# Patient Record
Sex: Female | Born: 1960 | Race: Black or African American | Hispanic: No | Marital: Single | Smoking: Never smoker
Health system: Southern US, Community
[De-identification: ages and names within clinical notes are randomized; demographics above are authoritative.]

## PROBLEM LIST (undated history)

## (undated) DIAGNOSIS — M1990 Unspecified osteoarthritis, unspecified site: Secondary | ICD-10-CM

## (undated) DIAGNOSIS — E785 Hyperlipidemia, unspecified: Secondary | ICD-10-CM

## (undated) DIAGNOSIS — S62609A Fracture of unspecified phalanx of unspecified finger, initial encounter for closed fracture: Secondary | ICD-10-CM

## (undated) DIAGNOSIS — I10 Essential (primary) hypertension: Secondary | ICD-10-CM

## (undated) HISTORY — PX: COLONOSCOPY: SHX174

---

## 1999-06-29 ENCOUNTER — Emergency Department (HOSPITAL_COMMUNITY): Admission: EM | Admit: 1999-06-29 | Discharge: 1999-06-29 | Payer: Self-pay | Admitting: Emergency Medicine

## 1999-07-20 ENCOUNTER — Other Ambulatory Visit: Admission: RE | Admit: 1999-07-20 | Discharge: 1999-07-20 | Payer: Self-pay | Admitting: Obstetrics and Gynecology

## 2001-01-02 ENCOUNTER — Other Ambulatory Visit: Admission: RE | Admit: 2001-01-02 | Discharge: 2001-01-02 | Payer: Self-pay | Admitting: Obstetrics and Gynecology

## 2003-12-03 ENCOUNTER — Other Ambulatory Visit: Admission: RE | Admit: 2003-12-03 | Discharge: 2003-12-03 | Payer: Self-pay | Admitting: Obstetrics and Gynecology

## 2005-03-29 ENCOUNTER — Other Ambulatory Visit: Admission: RE | Admit: 2005-03-29 | Discharge: 2005-03-29 | Payer: Self-pay | Admitting: Obstetrics and Gynecology

## 2005-07-19 ENCOUNTER — Emergency Department (HOSPITAL_COMMUNITY): Admission: EM | Admit: 2005-07-19 | Discharge: 2005-07-20 | Payer: Self-pay | Admitting: Emergency Medicine

## 2005-07-21 ENCOUNTER — Emergency Department (HOSPITAL_COMMUNITY): Admission: EM | Admit: 2005-07-21 | Discharge: 2005-07-21 | Payer: Self-pay | Admitting: Emergency Medicine

## 2008-02-06 ENCOUNTER — Emergency Department (HOSPITAL_COMMUNITY): Admission: EM | Admit: 2008-02-06 | Discharge: 2008-02-06 | Payer: Self-pay | Admitting: Emergency Medicine

## 2010-02-01 ENCOUNTER — Emergency Department (HOSPITAL_COMMUNITY): Admission: EM | Admit: 2010-02-01 | Discharge: 2010-02-01 | Payer: Self-pay | Admitting: Emergency Medicine

## 2012-10-28 ENCOUNTER — Encounter (HOSPITAL_COMMUNITY): Payer: Self-pay | Admitting: *Deleted

## 2012-10-28 ENCOUNTER — Emergency Department (INDEPENDENT_AMBULATORY_CARE_PROVIDER_SITE_OTHER)
Admission: EM | Admit: 2012-10-28 | Discharge: 2012-10-28 | Disposition: A | Discharge status: Home / Self Care | Payer: 59 | Source: Home / Self Care

## 2012-10-28 DIAGNOSIS — Z23 Encounter for immunization: Secondary | ICD-10-CM

## 2012-10-28 DIAGNOSIS — IMO0002 Reserved for concepts with insufficient information to code with codable children: Secondary | ICD-10-CM

## 2012-10-28 DIAGNOSIS — L02411 Cutaneous abscess of right axilla: Secondary | ICD-10-CM

## 2012-10-28 HISTORY — DX: Essential (primary) hypertension: I10

## 2012-10-28 MED ORDER — TETANUS-DIPHTH-ACELL PERTUSSIS 5-2.5-18.5 LF-MCG/0.5 IM SUSP
INTRAMUSCULAR | Status: AC
  Filled 2012-10-28: qty 0.5

## 2012-10-28 MED ORDER — TETANUS-DIPHTH-ACELL PERTUSSIS 5-2.5-18.5 LF-MCG/0.5 IM SUSP
0.5000 mL | Freq: Once | INTRAMUSCULAR | Status: AC
  Administered 2012-10-28: 0.5 mL via INTRAMUSCULAR

## 2012-10-28 NOTE — ED Provider Notes (Signed)
CSN: [id number]     Arrival date & time 10/28/12  1420 History     First MD Initiated Contact with Patient 10/28/12 1555     Chief Complaint  Patient presents with  . Abscess   (Consider location/radiation/quality/duration/timing/severity/associated sxs/prior Treatment) HPI Comments: 52 year old female presents complaining of abscess in her right armpit for the past 4 days. She has pain and pressure as well as obvious swelling in the area. She has been applying warm compresses to this hoping that it would start to drain but it has not. She did not have any previous history of abscesses or immune deficiencies. She denies any fever, chills, dizziness, NVD.  Patient is a 52 y.o. female presenting with abscess.  Abscess Associated symptoms: no fever, no nausea and no vomiting     Past Medical History  Diagnosis Date  . Hypertension    History reviewed. No pertinent past surgical history. No family history on file. History  Substance Use Topics  . Smoking status: Not on file  . Smokeless tobacco: Not on file  . Alcohol Use: No   OB History   Grav Para Term Preterm Abortions TAB SAB Ect Mult Living                 Review of Systems  Constitutional: Negative for fever and chills.  Eyes: Negative for visual disturbance.  Respiratory: Negative for cough and shortness of breath.   Cardiovascular: Negative for chest pain, palpitations and leg swelling.  Gastrointestinal: Negative for nausea, vomiting and abdominal pain.  Endocrine: Negative for polydipsia and polyuria.  Genitourinary: Negative for dysuria, urgency and frequency.  Musculoskeletal: Negative for myalgias and arthralgias.  Skin: Positive for wound (see history of present illness regarding abscess). Negative for rash.  Neurological: Negative for dizziness, weakness and light-headedness.    Allergies  Lisinopril  Home Medications   Current Outpatient Rx  Name  Route  Sig  Dispense  Refill  . amLODipine (NORVASC)  10 MG tablet   Oral   Take 10 mg by mouth daily.          BP 156/91  Pulse 76  Temp(Src) 98 F (36.7 C) (Oral)  Resp 17  SpO2 99% Physical Exam  Nursing note and vitals reviewed. Constitutional: She is oriented to person, place, and time. She appears well-developed and well-nourished.  HENT:  Head: Normocephalic and atraumatic.  Musculoskeletal: Normal range of motion.  Neurological: She is alert and oriented to person, place, and time.  Skin: Skin is warm and dry. Lesion (There is a spontaneously draining abscess in the right axilla with minimal surrounding induration) noted.  Psychiatric: She has a normal mood and affect. Judgment normal.    ED Course   INCISION AND DRAINAGE Date/Time: 10/29/2012 11:19 AM Performed by: Autumn Messing, H Authorized by: Autumn Messing, H Consent: Verbal consent obtained. Risks and benefits: risks, benefits and alternatives were discussed Consent given by: patient Patient understanding: patient states understanding of the procedure being performed Type: abscess Body area: upper extremity (Right axilla) Anesthesia: see MAR for details Local anesthetic: topical anesthetic Scalpel size: 11 Incision type: single straight Complexity: simple Drainage: purulent Drainage amount: moderate Wound treatment: drain placed Packing material: 1/4 in iodoform gauze Patient tolerance: Patient tolerated the procedure well with no immediate complications.   (including critical care time)  Labs Reviewed  CULTURE, ROUTINE-ABSCESS   No results found. 1. Abscess of axilla, right     MDM  This was all ready draining spontaneously, this draining tract was  expanded, all per material expressed, iodoform gauze packing placed. Culture sent.  No ABx indicated, they should resolve with incision and drainage.  F/u in 3 days for wound check and packing   Graylon Good, PA-C 10/29/12 1121

## 2012-10-28 NOTE — ED Notes (Signed)
Noticed small, hard lump to right axilla 8/5; has been applying warm compresses and Epsom salts, but abscess getting bigger.  Small amt purulent drainage noted at site.  Denies fevers.

## 2012-10-31 ENCOUNTER — Telehealth (HOSPITAL_COMMUNITY): Payer: Self-pay | Admitting: *Deleted

## 2012-10-31 ENCOUNTER — Emergency Department (INDEPENDENT_AMBULATORY_CARE_PROVIDER_SITE_OTHER)
Admission: EM | Admit: 2012-10-31 | Discharge: 2012-10-31 | Disposition: A | Discharge status: Home / Self Care | Payer: 59 | Source: Home / Self Care | Attending: Emergency Medicine | Admitting: Emergency Medicine

## 2012-10-31 ENCOUNTER — Encounter (HOSPITAL_COMMUNITY): Payer: Self-pay | Admitting: Emergency Medicine

## 2012-10-31 DIAGNOSIS — Z5189 Encounter for other specified aftercare: Secondary | ICD-10-CM

## 2012-10-31 LAB — CULTURE, ROUTINE-ABSCESS

## 2012-10-31 NOTE — ED Provider Notes (Addendum)
  CSN: [id number]     Arrival date & time 10/31/12  4098 History     First MD Initiated Contact with Patient 10/31/12 1013     Chief Complaint  Patient presents with  . Wound Check   (Consider location/radiation/quality/duration/timing/severity/associated sxs/prior Treatment) HPI Comments: Patient presents urgent care to have her wound check and repack. She describes a soreness and tenderness swelling has improved. It's been almost 4 days says she had a small procedure. She has not been taking any antibiotics and at this point she is uncertain if the packing fell out. No fevers no chills no increased tenderness and minimal  Patient is a 52 y.o. female presenting with wound check. The history is provided by the patient.  Wound Check This is a new problem. The current episode started 2 days ago. The problem has been rapidly improving. Nothing aggravates the symptoms. Treatments tried: s/p I. The treatment provided no relief.    Past Medical History  Diagnosis Date  . Hypertension    History reviewed. No pertinent past surgical history. No family history on file. History  Substance Use Topics  . Smoking status: Never Smoker   . Smokeless tobacco: Not on file  . Alcohol Use: No   OB History   Grav Para Term Preterm Abortions TAB SAB Ect Mult Living                 Review of Systems  Constitutional: Negative for fever, chills and diaphoresis.  Skin: Positive for wound. Negative for color change, pallor and rash.  Allergic/Immunologic: Negative for immunocompromised state.    Allergies  Lisinopril  Home Medications   Current Outpatient Rx  Name  Route  Sig  Dispense  Refill  . amLODipine (NORVASC) 10 MG tablet   Oral   Take 10 mg by mouth daily.          BP 136/97  Pulse 75  Temp(Src) 98.1 F (36.7 C) (Oral)  Resp 18  SpO2 100% Physical Exam  Nursing note and vitals reviewed. Constitutional: Vital signs are normal. She appears well-developed and well-nourished.   Non-toxic appearance. She does not have a sickly appearance. She does not appear ill. No distress.  Eyes: Conjunctivae are normal.  Neck: Neck supple.  Abdominal: She exhibits no distension and no mass. There is no guarding.  Musculoskeletal: She exhibits tenderness.  Neurological: She is alert.  Skin: No rash noted. No erythema.       ED Course   Procedures (including critical care time)  Labs Reviewed - No data to display No results found. 1. Encounter for wound re-check     MDM  Axillary abscess- recheck  Instructed patient to return if tenderness or swelling worsens. And if no full recovery after 7 days. As of her exam today I did not see any packing I suspect it fell off. Have encouraged patient to return if she feels tenderness to further explore her axillary region. At this point her wound was partially closed palpation didn't feel like it had any contents but cannot fully rule out residual or that the packing still remaining inside of the wound I have shared this with patient patient will return if no improvement after 7-10 days or will return sooner if worsening.  Patient is on no antibiotics Culture results predominantly staph aurus, ( final and no antibiotic sensitivities available) Jimmie Molly, MD 10/31/12 1125  Jimmie Molly, MD 10/31/12 1126

## 2012-10-31 NOTE — ED Notes (Signed)
Recheck of abscess, i/d site, right axilla.  Patient seen 8/9.  Reports feeling better

## 2012-10-31 NOTE — ED Notes (Signed)
Abscess culture L axilla: Abundant MRSA.  I and D only.  Discussed with Almedia Balls PA. He said the abscess was small and I and D should be sufficient,but call for clinical improvement. I called pt. Pt. verified x 2 and given results.  Pt. said it was getting better and no further drainage.  I reviewed th Jeff MRSA instructions for the pt. and she voiced understanding. Vassie Moselle 10/31/2012

## 2012-11-02 NOTE — ED Provider Notes (Signed)
Medical screening examination/treatment/procedure(s) were performed by a resident physician or non-physician practitioner and as the supervising physician I was immediately available for consultation/collaboration.  Clementeen Graham, MD   Rodolph Bong, MD 11/02/12 [phone]

## 2013-11-01 ENCOUNTER — Encounter (HOSPITAL_COMMUNITY): Payer: Self-pay | Admitting: Emergency Medicine

## 2013-11-01 ENCOUNTER — Emergency Department (HOSPITAL_COMMUNITY)
Admission: EM | Admit: 2013-11-01 | Discharge: 2013-11-01 | Disposition: A | Discharge status: Home / Self Care | Payer: Worker's Compensation | Attending: Emergency Medicine | Admitting: Emergency Medicine

## 2013-11-01 DIAGNOSIS — Y939 Activity, unspecified: Secondary | ICD-10-CM | POA: Insufficient documentation

## 2013-11-01 DIAGNOSIS — Z79899 Other long term (current) drug therapy: Secondary | ICD-10-CM | POA: Diagnosis not present

## 2013-11-01 DIAGNOSIS — S058X9A Other injuries of unspecified eye and orbit, initial encounter: Secondary | ICD-10-CM | POA: Diagnosis not present

## 2013-11-01 DIAGNOSIS — Y929 Unspecified place or not applicable: Secondary | ICD-10-CM | POA: Diagnosis not present

## 2013-11-01 DIAGNOSIS — S0510XA Contusion of eyeball and orbital tissues, unspecified eye, initial encounter: Secondary | ICD-10-CM | POA: Insufficient documentation

## 2013-11-01 DIAGNOSIS — S0010XA Contusion of unspecified eyelid and periocular area, initial encounter: Secondary | ICD-10-CM | POA: Insufficient documentation

## 2013-11-01 DIAGNOSIS — T1590XA Foreign body on external eye, part unspecified, unspecified eye, initial encounter: Secondary | ICD-10-CM | POA: Diagnosis not present

## 2013-11-01 DIAGNOSIS — I10 Essential (primary) hypertension: Secondary | ICD-10-CM | POA: Diagnosis not present

## 2013-11-01 DIAGNOSIS — S0501XA Injury of conjunctiva and corneal abrasion without foreign body, right eye, initial encounter: Secondary | ICD-10-CM

## 2013-11-01 MED ORDER — TETRACAINE HCL 0.5 % OP SOLN
1.0000 [drp] | Freq: Once | OPHTHALMIC | Status: AC
  Administered 2013-11-01: 1 [drp] via OPHTHALMIC
  Filled 2013-11-01: qty 2

## 2013-11-01 MED ORDER — FLUORESCEIN SODIUM 1 MG OP STRP
1.0000 | ORAL_STRIP | Freq: Once | OPHTHALMIC | Status: AC
  Administered 2013-11-01: 1 via OPHTHALMIC
  Filled 2013-11-01: qty 1

## 2013-11-01 MED ORDER — POLYMYXIN B-TRIMETHOPRIM 10000-0.1 UNIT/ML-% OP SOLN: 1.0000 [drp] | OPHTHALMIC | Status: DC

## 2013-11-01 NOTE — ED Notes (Signed)
Pt reports dirt fell into eye this evening and now she has discomfort to R eye.

## 2013-11-01 NOTE — ED Provider Notes (Signed)
CSN: [id number]     Arrival date & time 11/01/13  0026 History   First MD Initiated Contact with Patient 11/01/13 0043     Chief Complaint  Patient presents with  . Eye Pain     (Consider location/radiation/quality/duration/timing/severity/associated sxs/prior Treatment) Patient is a 52 y.o. female presenting with eye pain. The history is provided by the patient. No language interpreter was used.  Eye Pain This is a new problem. The current episode started today. The problem occurs constantly. The problem has been unchanged. Pertinent negatives include no abdominal pain, arthralgias, chest pain, chills, fatigue, fever, nausea, neck pain, vomiting or weakness. Nothing aggravates the symptoms. She has tried nothing for the symptoms. The treatment provided no relief.    Past Medical History  Diagnosis Date  . Hypertension    History reviewed. No pertinent past surgical history. No family history on file. History  Substance Use Topics  . Smoking status: Never Smoker   . Smokeless tobacco: Not on file  . Alcohol Use: No   OB History   Grav Para Term Preterm Abortions TAB SAB Ect Mult Living                 Review of Systems  Constitutional: Negative for fever, chills and fatigue.  HENT: Negative for trouble swallowing.   Eyes: Positive for pain and redness. Negative for visual disturbance.  Respiratory: Negative for shortness of breath.   Cardiovascular: Negative for chest pain and palpitations.  Gastrointestinal: Negative for nausea, vomiting, abdominal pain and diarrhea.  Genitourinary: Negative for dysuria and difficulty urinating.  Musculoskeletal: Negative for arthralgias and neck pain.  Skin: Negative for color change.  Neurological: Negative for dizziness and weakness.  Psychiatric/Behavioral: Negative for dysphoric mood.      Allergies  Lisinopril  Home Medications   Prior to Admission medications   Medication Sig Start Date End Date Taking? Authorizing  Provider  amLODipine (NORVASC) 10 MG tablet Take 10 mg by mouth daily.    Historical Provider, MD   BP 157/90  Pulse 73  Temp(Src) 97.6 F (36.4 C) (Oral)  Resp 16  Ht 5\' 4"  (1.626 m)  Wt 180 lb (81.647 kg)  BMI 30.88 kg/m2  SpO2 96% Physical Exam  Nursing note and vitals reviewed. Constitutional: She is oriented to person, place, and time. She appears well-developed and well-nourished. No distress.  HENT:  Head: Normocephalic and atraumatic.  Eyes: EOM are normal. Pupils are equal, round, and reactive to light.    Right eye conjunctival injection. Corneal abrasion noted at the 3 oclock position around the iris.   Neck: Normal range of motion.  Cardiovascular: Normal rate and regular rhythm.  Exam reveals no gallop and no friction rub.   No murmur heard. Pulmonary/Chest: Effort normal and breath sounds normal. She has no wheezes. She has no rales. She exhibits no tenderness.  Musculoskeletal: Normal range of motion.  Neurological: She is alert and oriented to person, place, and time. Coordination normal.  Speech is goal-oriented. Moves limbs without ataxia.   Skin: Skin is warm and dry.  Psychiatric: She has a normal mood and affect. Her behavior is normal.    ED Course  Procedures (including critical care time) Labs Review Labs Reviewed - No data to display  Imaging Review No results found.   EKG Interpretation None      MDM   Final diagnoses:  Injury of conjunctiva and corneal abrasion of right eye without foreign body, initial encounter   1:40 AM Patient has  a corneal abrasion. Patient will have polytrim drops. Vitals stable and patient afebrile.    Emilia BeckKaitlyn Jacion Dismore, PA-C 11/01/13 0144

## 2013-11-01 NOTE — ED Provider Notes (Signed)
Medical screening examination/treatment/procedure(s) were performed by non-physician practitioner and as supervising physician I was immediately available for consultation/collaboration.     Cletis Muma, MD 11/01/13 0616 

## 2013-11-01 NOTE — Discharge Instructions (Signed)
Use polytrim drops as directed. Refer to attached documents for more information.

## 2014-12-07 ENCOUNTER — Encounter (HOSPITAL_COMMUNITY): Payer: Self-pay | Admitting: Emergency Medicine

## 2014-12-07 ENCOUNTER — Emergency Department (INDEPENDENT_AMBULATORY_CARE_PROVIDER_SITE_OTHER)
Admission: EM | Admit: 2014-12-07 | Discharge: 2014-12-07 | Disposition: A | Discharge status: Home / Self Care | Payer: 59 | Source: Home / Self Care | Attending: Family Medicine | Admitting: Family Medicine

## 2014-12-07 DIAGNOSIS — L251 Unspecified contact dermatitis due to drugs in contact with skin: Secondary | ICD-10-CM

## 2014-12-07 MED ORDER — TRIAMCINOLONE ACETONIDE 0.1 % EX CREA: 1.0000 "application " | TOPICAL_CREAM | Freq: Two times a day (BID) | CUTANEOUS | Status: DC

## 2014-12-07 MED ORDER — PREDNISONE 20 MG PO TABS: ORAL_TABLET | ORAL | Status: DC

## 2014-12-07 NOTE — ED Provider Notes (Signed)
CSN: [id number]     Arrival date & time 12/07/14  1931 History   First MD Initiated Contact with Patient 12/07/14 2012     Chief Complaint  Patient presents with  . Rash  . Facial Swelling   (Consider location/radiation/quality/duration/timing/severity/associated sxs/prior Treatment) HPI Comments: 54 year old female apply sunscreen to her forearms and face 4 days ago. Later in the day she developed an itchy rash to the areas in which the sunscreen was applied. She has continued to have itching with a papular rash to her forearms and lesser to the face. There is mild swelling to the face and puffiness to the eyes. She denies problems breathing. Denies swelling in the throat or mouth. Denies cough. IDenies rash consistent with urticaria.  Patient is a 54 y.o. female presenting with rash.  Rash Associated symptoms: no shortness of breath and not wheezing     Past Medical History  Diagnosis Date  . Hypertension    History reviewed. No pertinent past surgical history. History reviewed. No pertinent family history. Social History  Substance Use Topics  . Smoking status: Never Smoker   . Smokeless tobacco: None  . Alcohol Use: No   OB History    No data available     Review of Systems  Constitutional: Negative.   HENT: Positive for facial swelling. Negative for mouth sores, rhinorrhea and sinus pressure.   Eyes: Negative.   Respiratory: Negative for cough, choking, shortness of breath and wheezing.   Cardiovascular: Negative for chest pain and leg swelling.  Gastrointestinal: Negative.   Skin: Positive for rash.  Neurological: Negative.     Allergies  Lisinopril  Home Medications   Prior to Admission medications   Medication Sig Start Date End Date Taking? Authorizing Provider  amLODipine (NORVASC) 10 MG tablet Take 10 mg by mouth daily.   Yes Historical Provider, MD  predniSONE (DELTASONE) 20 MG tablet Take 3 tabs po on first day, 2 tabs second day, 2 tabs third day, 1  tab fourth day, 1 tab 5th day. Take with food. 12/07/14   Hayden Rasmussen, NP  triamcinolone cream (KENALOG) 0.1 % Apply 1 application topically 2 (two) times daily. 12/07/14   Hayden Rasmussen, NP  trimethoprim-polymyxin b (POLYTRIM) ophthalmic solution Place 1 drop into the right eye every 4 (four) hours. 11/01/13   Emilia Beck, PA-C   Meds Ordered and Administered this Visit  Medications - No data to display  BP 145/84 mmHg  Pulse 83  Temp(Src) 98.2 F (36.8 C) (Oral)  Resp 16  SpO2 100% No data found.   Physical Exam  Constitutional: She is oriented to person, place, and time. She appears well-developed and well-nourished. No distress.  HENT:  Minor facial swelling/puffiness. No intraoral edema, swelling or erythema. Airway widely patent.  Eyes: Conjunctivae and EOM are normal.  Neck: Normal range of motion. Neck supple.  Cardiovascular: Normal rate and normal heart sounds.   Pulmonary/Chest: Effort normal and breath sounds normal. No respiratory distress.  Neurological: She is alert and oriented to person, place, and time.  Skin: Skin is warm and dry. Rash noted.  Nursing note and vitals reviewed.   ED Course  Procedures (including critical care time)  Labs Review Labs Reviewed - No data to display  Imaging Review No results found.   Visual Acuity Review  Right Eye Distance:   Left Eye Distance:   Bilateral Distance:    Right Eye Near:   Left Eye Near:    Bilateral Near:  MDM   1. Contact dermatitis due to drugs in contact with skin    Triamcinolone cream to affected areas twice a day Prednisone taper dose. May take Allegra or Claritin or Benadryl for itching and rash.   Hayden Rasmussen, NP 12/07/14 2042

## 2014-12-07 NOTE — Discharge Instructions (Signed)
Contact Dermatitis °Contact dermatitis is a reaction to certain substances that touch the skin. Contact dermatitis can be either irritant contact dermatitis or allergic contact dermatitis. Irritant contact dermatitis does not require previous exposure to the substance for a reaction to occur. Allergic contact dermatitis only occurs if you have been exposed to the substance before. Upon a repeat exposure, your body reacts to the substance.  °CAUSES  °Many substances can cause contact dermatitis. Irritant dermatitis is most commonly caused by repeated exposure to mildly irritating substances, such as: °· Makeup. °· Soaps. °· Detergents. °· Bleaches. °· Acids. °· Metal salts, such as nickel. °Allergic contact dermatitis is most commonly caused by exposure to: °· Poisonous plants. °· Chemicals (deodorants, shampoos). °· Jewelry. °· Latex. °· Neomycin in triple antibiotic cream. °· Preservatives in products, including clothing. °SYMPTOMS  °The area of skin that is exposed may develop: °· Dryness or flaking. °· Redness. °· Cracks. °· Itching. °· Pain or a burning sensation. °· Blisters. °With allergic contact dermatitis, there may also be swelling in areas such as the eyelids, mouth, or genitals.  °DIAGNOSIS  °Your caregiver can usually tell what the problem is by doing a physical exam. In cases where the cause is uncertain and an allergic contact dermatitis is suspected, a patch skin test may be performed to help determine the cause of your dermatitis. °TREATMENT °Treatment includes protecting the skin from further contact with the irritating substance by avoiding that substance if possible. Barrier creams, powders, and gloves may be helpful. Your caregiver may also recommend: °· Steroid creams or ointments applied 2 times daily. For best results, soak the rash area in cool water for 20 minutes. Then apply the medicine. Cover the area with a plastic wrap. You can store the steroid cream in the refrigerator for a "chilly"  effect on your rash. That may decrease itching. Oral steroid medicines may be needed in more severe cases. °· Antibiotics or antibacterial ointments if a skin infection is present. °· Antihistamine lotion or an antihistamine taken by mouth to ease itching. °· Lubricants to keep moisture in your skin. °· Burow's solution to reduce redness and soreness or to dry a weeping rash. Mix one packet or tablet of solution in 2 cups cool water. Dip a clean washcloth in the mixture, wring it out a bit, and put it on the affected area. Leave the cloth in place for 30 minutes. Do this as often as possible throughout the day. °· Taking several cornstarch or baking soda baths daily if the area is too large to cover with a washcloth. °Harsh chemicals, such as alkalis or acids, can cause skin damage that is like a burn. You should flush your skin for 15 to 20 minutes with cold water after such an exposure. You should also seek immediate medical care after exposure. Bandages (dressings), antibiotics, and pain medicine may be needed for severely irritated skin.  °HOME CARE INSTRUCTIONS °· Avoid the substance that caused your reaction. °· Keep the area of skin that is affected away from hot water, soap, sunlight, chemicals, acidic substances, or anything else that would irritate your skin. °· Do not scratch the rash. Scratching may cause the rash to become infected. °· You may take cool baths to help stop the itching. °· Only take over-the-counter or prescription medicines as directed by your caregiver. °· See your caregiver for follow-up care as directed to make sure your skin is healing properly. °SEEK MEDICAL CARE IF:  °· Your condition is not better after 3   days of treatment. °· You seem to be getting worse. °· You see signs of infection such as swelling, tenderness, redness, soreness, or warmth in the affected area. °· You have any problems related to your medicines. °Document Released: 03/05/2000 Document Revised: 05/31/2011  Document Reviewed: 08/11/2010 °[employer]® Patient Information ©2015 [employer]. This information is not intended to replace advice given to you by your health care provider. Make sure you discuss any questions you have with your health care provider. ° °Rash °A rash is a change in the color or texture of your skin. There are many different types of rashes. You may have other problems that accompany your rash. °CAUSES  °· Infections. °· Allergic reactions. This can include allergies to pets or foods. °· Certain medicines. °· Exposure to certain chemicals, soaps, or cosmetics. °· Heat. °· Exposure to poisonous plants. °· Tumors, both cancerous and noncancerous. °SYMPTOMS  °· Redness. °· Scaly skin. °· Itchy skin. °· Dry or cracked skin. °· Bumps. °· Blisters. °· Pain. °DIAGNOSIS  °Your caregiver may do a physical exam to determine what type of rash you have. A skin sample (biopsy) may be taken and examined under a microscope. °TREATMENT  °Treatment depends on the type of rash you have. Your caregiver may prescribe certain medicines. For serious conditions, you may need to see a skin doctor (dermatologist). °HOME CARE INSTRUCTIONS  °· Avoid the substance that caused your rash. °· Do not scratch your rash. This can cause infection. °· You may take cool baths to help stop itching. °· Only take over-the-counter or prescription medicines as directed by your caregiver. °· Keep all follow-up appointments as directed by your caregiver. °SEEK IMMEDIATE MEDICAL CARE IF: °· You have increasing pain, swelling, or redness. °· You have a fever. °· You have new or severe symptoms. °· You have body aches, diarrhea, or vomiting. °· Your rash is not better after 3 days. °MAKE SURE YOU: °· Understand these instructions. °· Will watch your condition. °· Will get help right away if you are not doing well or get worse. °Document Released: 02/26/2002 Document Revised: 05/31/2011 Document Reviewed: 12/21/2010 °[employer]® Patient Information  ©2015 [employer]. This information is not intended to replace advice given to you by your health care provider. Make sure you discuss any questions you have with your health care provider. ° °

## 2014-12-07 NOTE — ED Notes (Signed)
The patient presented to the [facility] with a complaint of having a reaction to a sunscreen that was applied 5 days ago. She stated that her arms developed a rash and she apparently touched her face as well. The patient stated that she was having itching and swelling to her face. The patient stated that she tried benedryl with no relief.

## 2015-12-30 ENCOUNTER — Other Ambulatory Visit: Payer: Self-pay | Admitting: Obstetrics and Gynecology

## 2015-12-30 DIAGNOSIS — R928 Other abnormal and inconclusive findings on diagnostic imaging of breast: Secondary | ICD-10-CM

## 2015-12-31 ENCOUNTER — Other Ambulatory Visit: Payer: Self-pay

## 2015-12-31 ENCOUNTER — Other Ambulatory Visit: Payer: Self-pay | Admitting: Obstetrics and Gynecology

## 2015-12-31 DIAGNOSIS — R928 Other abnormal and inconclusive findings on diagnostic imaging of breast: Secondary | ICD-10-CM

## 2016-01-01 ENCOUNTER — Ambulatory Visit
Admission: RE | Admit: 2016-01-01 | Discharge: 2016-01-01 | Disposition: A | Discharge status: Home / Self Care | Payer: BLUE CROSS/BLUE SHIELD | Source: Ambulatory Visit | Attending: Obstetrics and Gynecology | Admitting: Obstetrics and Gynecology

## 2016-01-01 ENCOUNTER — Ambulatory Visit
Admission: RE | Admit: 2016-01-01 | Discharge: 2016-01-01 | Disposition: A | Discharge status: Home / Self Care | Payer: 59 | Source: Ambulatory Visit | Attending: Obstetrics and Gynecology | Admitting: Obstetrics and Gynecology

## 2016-01-01 DIAGNOSIS — R928 Other abnormal and inconclusive findings on diagnostic imaging of breast: Secondary | ICD-10-CM

## 2016-04-27 DIAGNOSIS — M1711 Unilateral primary osteoarthritis, right knee: Secondary | ICD-10-CM | POA: Diagnosis not present

## 2016-05-17 DIAGNOSIS — I10 Essential (primary) hypertension: Secondary | ICD-10-CM | POA: Diagnosis not present

## 2016-05-17 DIAGNOSIS — E785 Hyperlipidemia, unspecified: Secondary | ICD-10-CM | POA: Diagnosis not present

## 2016-06-14 DIAGNOSIS — M65871 Other synovitis and tenosynovitis, right ankle and foot: Secondary | ICD-10-CM | POA: Diagnosis not present

## 2016-06-14 DIAGNOSIS — M722 Plantar fascial fibromatosis: Secondary | ICD-10-CM | POA: Diagnosis not present

## 2016-06-14 DIAGNOSIS — M25571 Pain in right ankle and joints of right foot: Secondary | ICD-10-CM | POA: Diagnosis not present

## 2016-06-14 DIAGNOSIS — M25774 Osteophyte, right foot: Secondary | ICD-10-CM | POA: Diagnosis not present

## 2016-06-14 DIAGNOSIS — M79671 Pain in right foot: Secondary | ICD-10-CM | POA: Diagnosis not present

## 2016-06-22 ENCOUNTER — Encounter (HOSPITAL_BASED_OUTPATIENT_CLINIC_OR_DEPARTMENT_OTHER): Payer: Self-pay | Admitting: *Deleted

## 2016-06-22 ENCOUNTER — Other Ambulatory Visit: Payer: Self-pay | Admitting: Orthopedic Surgery

## 2016-06-22 DIAGNOSIS — S62616A Displaced fracture of proximal phalanx of right little finger, initial encounter for closed fracture: Secondary | ICD-10-CM | POA: Diagnosis not present

## 2016-06-23 NOTE — H&P (Signed)
Traci Baker is an 56 y.o. female.   CC / Reason for Visit: Right small finger injury HPI: This patient is a 56 year old RHD female who presents for evaluation of her right small finger injury that occurred when she was on the softball field.  She has had pain, swelling, and some deformity.  Past Medical History:  Diagnosis Date  . Arthritis   . Finger fracture, right    right small finger  . Hyperlipidemia   . Hypertension     Past Surgical History:  Procedure Laterality Date  . COLONOSCOPY      History reviewed. No pertinent family history. Social History:  reports that she has never smoked. She has never used smokeless tobacco. She reports that she does not drink alcohol or use drugs.  Allergies:  Allergies  Allergen Reactions  . Lisinopril Anaphylaxis    No prescriptions prior to admission.    No results found for this or any previous visit (from the past 48 hour(s)). No results found.  Review of Systems  All other systems reviewed and are negative.   Height  (1.626 m), weight 81.6 kg (180 lb). Physical Exam  Constitutional:  WD, WN, NAD HEENT:  NCAT, EOMI Neuro/Psych:  Alert & oriented to person, place, and time; appropriate mood & affect Lymphatic: No generalized UE edema or lymphadenopathy Extremities / MSK:  Both UE are normal with respect to appearance, ranges of motion, joint stability, muscle strength/tone, sensation, & perfusion except as otherwise noted:  Right small finger tender over its base, with obvious extension and ulnar angular deformities to the small finger proximal phalanx.  Good flexion with near normal touchdown point.  Slight extensor lag at the PIP  Labs / Xrays:  3 views of the right small finger ordered and obtained today reveal a displaced fracture through the proximal metaphysis of P1, with mild ulnar and dorsal angulation  Assessment: Right small finger mildly angulated P1 fracture  Plan:  I discussed these findings with her  and the options of nonoperative management, accepting the present deformity versus operative management to obtain and maintain a more anatomic alignment of the digit.  After some careful consideration, she indicated she wished to proceed surgically.  I offered Friday, but she indicated that Monday would be preferred, so we will plan for that.  She will return to therapy within 3-5 days postop to have a splint constructed and begin rehabilitation, and follow-up with [place] in 10-15 days with new x-rays.  The details of the operative procedure were discussed with the patient.  Questions were invited and answered.  In addition to the goal of the procedure, the risks of the procedure to include but not limited to bleeding; infection; damage to the nerves or blood vessels that could result in bleeding, numbness, weakness, chronic pain, and the need for additional procedures; stiffness; the need for revision surgery; and anesthetic risks were reviewed.  No specific outcome was guaranteed or implied.  Informed consent was obtained.  Wadsworth Skolnick A., MD 06/23/2016, 5:38 PM

## 2016-06-24 ENCOUNTER — Encounter (HOSPITAL_BASED_OUTPATIENT_CLINIC_OR_DEPARTMENT_OTHER)
Admission: RE | Admit: 2016-06-24 | Discharge: 2016-06-24 | Disposition: A | Discharge status: Home / Self Care | Payer: 59 | Source: Ambulatory Visit | Attending: Orthopedic Surgery | Admitting: Orthopedic Surgery

## 2016-06-24 DIAGNOSIS — Z01818 Encounter for other preprocedural examination: Secondary | ICD-10-CM | POA: Diagnosis present

## 2016-06-28 ENCOUNTER — Ambulatory Visit (HOSPITAL_BASED_OUTPATIENT_CLINIC_OR_DEPARTMENT_OTHER)
Admission: RE | Admit: 2016-06-28 | Discharge: 2016-06-28 | Disposition: A | Discharge status: Home / Self Care | Payer: 59 | Source: Ambulatory Visit | Attending: Orthopedic Surgery | Admitting: Orthopedic Surgery

## 2016-06-28 ENCOUNTER — Ambulatory Visit (HOSPITAL_COMMUNITY): Payer: 59

## 2016-06-28 ENCOUNTER — Encounter (HOSPITAL_BASED_OUTPATIENT_CLINIC_OR_DEPARTMENT_OTHER)
Admission: RE | Disposition: A | Discharge status: Home / Self Care | Payer: Self-pay | Source: Ambulatory Visit | Attending: Orthopedic Surgery

## 2016-06-28 ENCOUNTER — Ambulatory Visit (HOSPITAL_BASED_OUTPATIENT_CLINIC_OR_DEPARTMENT_OTHER): Payer: 59 | Admitting: Anesthesiology

## 2016-06-28 ENCOUNTER — Encounter (HOSPITAL_BASED_OUTPATIENT_CLINIC_OR_DEPARTMENT_OTHER): Payer: Self-pay | Admitting: *Deleted

## 2016-06-28 DIAGNOSIS — Z791 Long term (current) use of non-steroidal anti-inflammatories (NSAID): Secondary | ICD-10-CM | POA: Diagnosis not present

## 2016-06-28 DIAGNOSIS — I10 Essential (primary) hypertension: Secondary | ICD-10-CM | POA: Diagnosis not present

## 2016-06-28 DIAGNOSIS — X58XXXA Exposure to other specified factors, initial encounter: Secondary | ICD-10-CM | POA: Diagnosis not present

## 2016-06-28 DIAGNOSIS — S62616A Displaced fracture of proximal phalanx of right little finger, initial encounter for closed fracture: Secondary | ICD-10-CM | POA: Diagnosis not present

## 2016-06-28 DIAGNOSIS — E785 Hyperlipidemia, unspecified: Secondary | ICD-10-CM | POA: Diagnosis not present

## 2016-06-28 DIAGNOSIS — Z79899 Other long term (current) drug therapy: Secondary | ICD-10-CM | POA: Insufficient documentation

## 2016-06-28 DIAGNOSIS — Z4789 Encounter for other orthopedic aftercare: Secondary | ICD-10-CM

## 2016-06-28 HISTORY — DX: Hyperlipidemia, unspecified: E78.5

## 2016-06-28 HISTORY — DX: Unspecified osteoarthritis, unspecified site: M19.90

## 2016-06-28 HISTORY — DX: Fracture of unspecified phalanx of unspecified finger, initial encounter for closed fracture: S62.609A

## 2016-06-28 HISTORY — PX: MINOR OPEN REDUCTION INTERNAL FIXATION (ORIF) FINGER: SHX6684

## 2016-06-28 SURGERY — MINOR OPEN REDUCTION INTERNAL FIXATION (ORIF) FINGER
Anesthesia: General | Site: Finger | Laterality: Right

## 2016-06-28 MED ORDER — MIDAZOLAM HCL 2 MG/2ML IJ SOLN
1.0000 mg | INTRAMUSCULAR | Status: DC | PRN
  Administered 2016-06-28: 2 mg via INTRAVENOUS

## 2016-06-28 MED ORDER — LIDOCAINE 2% (20 MG/ML) 5 ML SYRINGE
INTRAMUSCULAR | Status: DC | PRN
  Administered 2016-06-28: 100 mg via INTRAVENOUS

## 2016-06-28 MED ORDER — FENTANYL CITRATE (PF) 100 MCG/2ML IJ SOLN
INTRAMUSCULAR | Status: AC
  Filled 2016-06-28: qty 2

## 2016-06-28 MED ORDER — ONDANSETRON HCL 4 MG/2ML IJ SOLN
INTRAMUSCULAR | Status: DC | PRN
  Administered 2016-06-28: 4 mg via INTRAVENOUS

## 2016-06-28 MED ORDER — LACTATED RINGERS IV SOLN: INTRAVENOUS | Status: DC

## 2016-06-28 MED ORDER — BUPIVACAINE HCL (PF) 0.5 % IJ SOLN
INTRAMUSCULAR | Status: AC
  Filled 2016-06-28: qty 30

## 2016-06-28 MED ORDER — MIDAZOLAM HCL 2 MG/2ML IJ SOLN
INTRAMUSCULAR | Status: AC
  Filled 2016-06-28: qty 2

## 2016-06-28 MED ORDER — LACTATED RINGERS IV SOLN
INTRAVENOUS | Status: DC
  Administered 2016-06-28 (×2): via INTRAVENOUS

## 2016-06-28 MED ORDER — CEFAZOLIN SODIUM-DEXTROSE 2-4 GM/100ML-% IV SOLN
INTRAVENOUS | Status: AC
  Filled 2016-06-28: qty 100

## 2016-06-28 MED ORDER — LIDOCAINE 2% (20 MG/ML) 5 ML SYRINGE
INTRAMUSCULAR | Status: AC
  Filled 2016-06-28: qty 5

## 2016-06-28 MED ORDER — BUPIVACAINE HCL 0.5 % IJ SOLN
INTRAMUSCULAR | Status: DC | PRN
  Administered 2016-06-28: 9 mL

## 2016-06-28 MED ORDER — FENTANYL CITRATE (PF) 100 MCG/2ML IJ SOLN
50.0000 ug | INTRAMUSCULAR | Status: DC | PRN
  Administered 2016-06-28: 100 ug via INTRAVENOUS

## 2016-06-28 MED ORDER — DEXAMETHASONE SODIUM PHOSPHATE 10 MG/ML IJ SOLN
INTRAMUSCULAR | Status: DC | PRN
  Administered 2016-06-28: 10 mg via INTRAVENOUS

## 2016-06-28 MED ORDER — DEXAMETHASONE SODIUM PHOSPHATE 10 MG/ML IJ SOLN
INTRAMUSCULAR | Status: AC
  Filled 2016-06-28: qty 1

## 2016-06-28 MED ORDER — PROPOFOL 10 MG/ML IV BOLUS
INTRAVENOUS | Status: DC | PRN
  Administered 2016-06-28: 200 mg via INTRAVENOUS

## 2016-06-28 MED ORDER — FENTANYL CITRATE (PF) 100 MCG/2ML IJ SOLN: 25.0000 ug | INTRAMUSCULAR | Status: DC | PRN

## 2016-06-28 MED ORDER — SCOPOLAMINE 1 MG/3DAYS TD PT72: 1.0000 | MEDICATED_PATCH | Freq: Once | TRANSDERMAL | Status: DC | PRN

## 2016-06-28 MED ORDER — MEPERIDINE HCL 25 MG/ML IJ SOLN: 6.2500 mg | INTRAMUSCULAR | Status: DC | PRN

## 2016-06-28 MED ORDER — ACETAMINOPHEN 325 MG PO TABS: 650.0000 mg | ORAL_TABLET | Freq: Four times a day (QID) | ORAL | Status: AC | PRN

## 2016-06-28 MED ORDER — OXYCODONE HCL 5 MG PO TABS: 5.0000 mg | ORAL_TABLET | Freq: Four times a day (QID) | ORAL | 0 refills | Status: DC | PRN

## 2016-06-28 MED ORDER — ONDANSETRON HCL 4 MG/2ML IJ SOLN
INTRAMUSCULAR | Status: AC
  Filled 2016-06-28: qty 2

## 2016-06-28 MED ORDER — METOCLOPRAMIDE HCL 5 MG/ML IJ SOLN: 10.0000 mg | Freq: Once | INTRAMUSCULAR | Status: DC | PRN

## 2016-06-28 MED ORDER — CEFAZOLIN SODIUM-DEXTROSE 2-4 GM/100ML-% IV SOLN
2.0000 g | INTRAVENOUS | Status: AC
  Administered 2016-06-28: 2 g via INTRAVENOUS

## 2016-06-28 SURGICAL SUPPLY — 59 items
BANDAGE COBAN STERILE 2 (GAUZE/BANDAGES/DRESSINGS) IMPLANT
BIT DRILL 1.1 MINI QC NONSTRL (BIT) ×1 IMPLANT
BLADE MINI RND TIP GREEN BEAV (BLADE) IMPLANT
BLADE SURG 15 STRL LF DISP TIS (BLADE) ×1 IMPLANT
BLADE SURG 15 STRL SS (BLADE) ×2
BNDG CMPR 9X4 STRL LF SNTH (GAUZE/BANDAGES/DRESSINGS)
BNDG COHESIVE 4X5 TAN STRL (GAUZE/BANDAGES/DRESSINGS) ×2 IMPLANT
BNDG ESMARK 4X9 LF (GAUZE/BANDAGES/DRESSINGS) IMPLANT
BNDG GAUZE ELAST 4 BULKY (GAUZE/BANDAGES/DRESSINGS) ×2 IMPLANT
CAP PIN PROTECTOR ORTHO WHT (CAP) IMPLANT
CHLORAPREP W/TINT 26ML (MISCELLANEOUS) ×2 IMPLANT
CORDS BIPOLAR (ELECTRODE) ×2 IMPLANT
COVER BACK TABLE 60X90IN (DRAPES) ×2 IMPLANT
COVER MAYO STAND STRL (DRAPES) ×2 IMPLANT
CUFF TOURNIQUET SINGLE 18IN (TOURNIQUET CUFF) ×2 IMPLANT
DRAPE C-ARM 42X72 X-RAY (DRAPES) ×2 IMPLANT
DRAPE EXTREMITY T 121X128X90 (DRAPE) ×2 IMPLANT
DRAPE SURG 17X23 STRL (DRAPES) ×2 IMPLANT
DRSG EMULSION OIL 3X3 NADH (GAUZE/BANDAGES/DRESSINGS) ×2 IMPLANT
GAUZE SPONGE 4X4 12PLY STRL LF (GAUZE/BANDAGES/DRESSINGS) ×2 IMPLANT
GLOVE BIO SURGEON STRL SZ 6.5 (GLOVE) ×1 IMPLANT
GLOVE BIO SURGEON STRL SZ7.5 (GLOVE) ×2 IMPLANT
GLOVE BIOGEL PI IND STRL 7.0 (GLOVE) ×1 IMPLANT
GLOVE BIOGEL PI IND STRL 8 (GLOVE) ×1 IMPLANT
GLOVE BIOGEL PI INDICATOR 7.0 (GLOVE) ×3
GLOVE BIOGEL PI INDICATOR 8 (GLOVE) ×1
GLOVE ECLIPSE 6.5 STRL STRAW (GLOVE) ×2 IMPLANT
GOWN STRL REUS W/ TWL LRG LVL3 (GOWN DISPOSABLE) ×2 IMPLANT
GOWN STRL REUS W/TWL LRG LVL3 (GOWN DISPOSABLE) ×4
GOWN STRL REUS W/TWL XL LVL3 (GOWN DISPOSABLE) ×2 IMPLANT
K-WIRE .045X4 (WIRE) IMPLANT
K-WIRE 9  SMOOTH .045 (WIRE) IMPLANT
LOCK SCREW 1.5X15MM (Screw) ×2 IMPLANT
LOCK SCREW 1.5X9MM (Screw) ×2 IMPLANT
NDL HYPO 25X1 1.5 SAFETY (NEEDLE) IMPLANT
NEEDLE HYPO 25X1 1.5 SAFETY (NEEDLE) IMPLANT
NS IRRIG 1000ML POUR BTL (IV SOLUTION) ×2 IMPLANT
PACK BASIN DAY SURGERY FS (CUSTOM PROCEDURE TRAY) ×2 IMPLANT
PADDING CAST ABS 4INX4YD NS (CAST SUPPLIES) ×1
PADDING CAST ABS COTTON 4X4 ST (CAST SUPPLIES) ×1 IMPLANT
PLATE T SMALL 1.5MM (Plate) ×1 IMPLANT
RUBBERBAND STERILE (MISCELLANEOUS) ×1 IMPLANT
SCREW L 1.5X14 (Screw) ×1 IMPLANT
SCREW LOCK 1.5X15MM (Screw) IMPLANT
SCREW LOCK 1.5X9MM (Screw) IMPLANT
SCREW LOCKING 1.5X10 (Screw) ×1 IMPLANT
SCREW LOCKING 1.5X11MM (Screw) ×2 IMPLANT
SCREW LOCKING 1.5X13MM (Screw) ×1 IMPLANT
SPLINT PLASTER CAST XFAST 3X15 (CAST SUPPLIES) IMPLANT
SPLINT PLASTER XTRA FASTSET 3X (CAST SUPPLIES)
STOCKINETTE 6  STRL (DRAPES) ×1
STOCKINETTE 6 STRL (DRAPES) ×1 IMPLANT
SUT VICRYL RAPIDE 4-0 (SUTURE) ×2 IMPLANT
SUT VICRYL RAPIDE 4/0 PS 2 (SUTURE) ×1 IMPLANT
SYR 10ML LL (SYRINGE) ×2 IMPLANT
SYR BULB 3OZ (MISCELLANEOUS) IMPLANT
TOWEL OR 17X24 6PK STRL BLUE (TOWEL DISPOSABLE) ×2 IMPLANT
TOWEL OR NON WOVEN STRL DISP B (DISPOSABLE) ×2 IMPLANT
UNDERPAD 30X30 (UNDERPADS AND DIAPERS) ×2 IMPLANT

## 2016-06-28 NOTE — Interval H&P Note (Signed)
History and Physical Interval Note:  06/28/2016 9:25 AM  Traci Baker  has presented today for surgery, with the diagnosis of RIGHT SMALL FINGER FRACTURE S62.616A  The various methods of treatment have been discussed with the patient and family. After consideration of risks, benefits and other options for treatment, the patient has consented to  Procedure(s): OPEN TREATMENT OF RIGHT SMALL FINGER FRACTURE (Right) as a surgical intervention .  The patient's history has been reviewed, patient examined, no change in status, stable for surgery.  I have reviewed the patient's chart and labs.  Questions were answered to the patient's satisfaction.     [name] A.

## 2016-06-28 NOTE — Anesthesia Procedure Notes (Signed)
Procedure Name: LMA Insertion Date/Time: 06/28/2016 9:34 AM Performed by: Caren Macadam Pre-anesthesia Checklist: Patient identified, Emergency Drugs available, Suction available and Patient being monitored Patient Re-evaluated:Patient Re-evaluated prior to inductionOxygen Delivery Method: Circle system utilized Preoxygenation: Pre-oxygenation with 100% oxygen Intubation Type: IV induction Ventilation: Mask ventilation without difficulty LMA: LMA inserted LMA Size: 4.0 Number of attempts: 1 Airway Equipment and Method: Bite block Placement Confirmation: positive ETCO2 and breath sounds checked- equal and bilateral Tube secured with: Tape Dental Injury: Teeth and Oropharynx as per pre-operative assessment

## 2016-06-28 NOTE — Transfer of Care (Signed)
Immediate Anesthesia Transfer of Care Note  Patient: Traci Baker  Procedure(s) Performed: Procedure(s): OPEN TREATMENT OF RIGHT SMALL FINGER FRACTURE (Right)  Patient Location: PACU  Anesthesia Type:General  Level of Consciousness: sedated  Airway & Oxygen Therapy: Patient Spontanous Breathing and Patient connected to face mask oxygen  Post-op Assessment: Report given to RN and Post -op Vital signs reviewed and stable  Post vital signs: Reviewed and stable  Last Vitals:  Vitals:   06/28/16 0831  BP: (!) 150/89  Pulse: 73  Resp: 18  Temp: 36.4 C    Last Pain:  Vitals:   06/28/16 0831  TempSrc: Oral  PainSc: 2       Patients Stated Pain Goal: 3 (06/28/16 0831)  Complications: No apparent anesthesia complications

## 2016-06-28 NOTE — Op Note (Signed)
06/28/2016  9:25 AM  PATIENT:  Traci Baker  56 y.o. female  PRE-OPERATIVE DIAGNOSIS:  Displaced right small finger P1 fracture  POST-OPERATIVE DIAGNOSIS:  Same  PROCEDURE:  ORIF right SF P1 fracture  SURGEON: Cliffton Asters. Janee Morn, MD  PHYSICIAN ASSISTANT: Danielle Rankin, OPA-C  ANESTHESIA:  general  SPECIMENS:  None  DRAINS:   None  EBL:  less than 50 mL  PREOPERATIVE INDICATIONS:  Traci Baker is a  56 y.o. female with displaced fracture through the base of the right small finger proximal phalanx  The risks benefits and alternatives were discussed with the patient preoperatively including but not limited to the risks of infection, bleeding, nerve injury, cardiopulmonary complications, the need for revision surgery, among others, and the patient verbalized understanding and consented to proceed.  OPERATIVE IMPLANTS: Biomet Alps handset, 1.5 mm small T plate and screws  OPERATIVE PROCEDURE:  After receiving prophylactic antibiotics, the patient was escorted to the operative theatre and placed in a supine position.  A surgical "time-out" was performed during which the planned procedure, proposed operative site, and the correct patient identity were compared to the operative consent and agreement confirmed by the circulating nurse according to current facility policy.  Following application of a tourniquet to the operative extremity, the exposed skin was prepped with Chloraprep and draped in the usual sterile fashion.  The limb was exsanguinated with an Esmarch bandage and the tourniquet inflated to approximately higher than systolic BP.  An incision was marked over the dorsal radial aspect of the proximal phalanx, coursing more obliquely and ulnarly across the MP joint.  Full-thickness flap was made and elevated.  Extensor tendon apparatus was split down the midline and reflected.  Periosteum was split ulnar of midline and flaps reflected with subperiosteal dissection.  The  fracture was encountered and was extra-articular.  It was manually reduced, reversing the extension through the fracture.  A small T plate was then laid onto the dorsum, secured distally with locking screws and then proximally the T portion of the plate was contoured and the proximal screws filled.  The hardware was extra-articular and the reduction was near-anatomic.  Stability was excellent rotation was normal.  Final fluoroscopic images were obtained.  The wound was irrigated and the periosteum reapproximated with 4-0 Vicryl running suture.  Extensor apparatus was closed with the same stitch.  Tourniquet was released some additional hemostasis obtained and the skin was reapproximated with 4-0 Vicryl Rapide interrupted sutures.  Half percent Marcaine with epinephrine was instilled at the base of the digit to help with postoperative pain control.  A short arm splint dressing was applied and she was awakened and taken to the recovery room in stable condition, breathing spontaneously.  DISPOSITION: She'll be discharged home today, with typical postop instructions.  We will set up a therapy appointment for her in the next several days to have a custom splint fabricated and begin range of motion exercises, returning to see [place] in 7-10 days with new x-rays of the right small finger out of splint and further determination of appropriate work status.  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

## 2016-06-28 NOTE — Anesthesia Preprocedure Evaluation (Signed)
Anesthesia Evaluation  Patient identified by MRN, date of birth, ID band Patient awake    Reviewed: Allergy & Precautions, NPO status , Patient's Chart, lab work & pertinent test results  Airway Mallampati: II  TM Distance: >3 FB Neck ROM: Full    Dental no notable dental hx. (+) Partial Upper, Partial Lower   Pulmonary neg pulmonary ROS,    Pulmonary exam normal breath sounds clear to auscultation       Cardiovascular hypertension, Pt. on medications Normal cardiovascular exam Rhythm:Regular Rate:Normal     Neuro/Psych negative neurological ROS  negative psych ROS   GI/Hepatic negative GI ROS, Neg liver ROS,   Endo/Other  negative endocrine ROS  Renal/GU negative Renal ROS  negative genitourinary   Musculoskeletal negative musculoskeletal ROS (+)   Abdominal   Peds negative pediatric ROS (+)  Hematology negative hematology ROS (+)   Anesthesia Other Findings   Reproductive/Obstetrics negative OB ROS                             Anesthesia Physical Anesthesia Plan  ASA: II  Anesthesia Plan: General   Post-op Pain Management:    Induction: Intravenous  Airway Management Planned: LMA  Additional Equipment:   Intra-op Plan:   Post-operative Plan: Extubation in OR  Informed Consent: I have reviewed the patients History and Physical, chart, labs and discussed the procedure including the risks, benefits and alternatives for the proposed anesthesia with the patient or authorized representative who has indicated his/her understanding and acceptance.   Dental advisory given  Plan Discussed with: CRNA  Anesthesia Plan Comments:         Anesthesia Quick Evaluation

## 2016-06-28 NOTE — Anesthesia Postprocedure Evaluation (Signed)
Anesthesia Post Note  Patient: Traci Baker  Procedure(s) Performed: Procedure(s) (LRB): OPEN TREATMENT OF RIGHT SMALL FINGER FRACTURE (Right)  Patient location during evaluation: PACU Anesthesia Type: General Level of consciousness: awake and alert Pain management: pain level controlled Vital Signs Assessment: post-procedure vital signs reviewed and stable Respiratory status: spontaneous breathing, nonlabored ventilation, respiratory function stable and patient connected to nasal cannula oxygen Cardiovascular status: blood pressure returned to baseline and stable Postop Assessment: no signs of nausea or vomiting Anesthetic complications: no       Last Vitals:  Vitals:   06/28/16 1100 06/28/16 1120  BP: (!) 152/97 (!) 166/91  Pulse: 72 67  Resp: 14 18  Temp:  36.4 C    Last Pain:  Vitals:   06/28/16 1120  TempSrc:   PainSc: 4                  Phillips Grout

## 2016-06-28 NOTE — Discharge Instructions (Signed)
Discharge Instructions   You have a dressing with a plaster splint incorporated in it. Move your fingers as much as possible, making a full fist and fully opening the fist. Elevate your hand to reduce pain & swelling of the digits.  Ice over the operative site may be helpful to reduce pain & swelling.  DO NOT USE HEAT.  Continue with your prescribed NSAID (diclofenac).Take Tylenol as it states on the bottle every 6 hours. Take Oxycodone for severe break through pain. Leave the dressing in place until you return to our office.  You may shower, but keep the bandage clean & dry.  You may drive a car when you are off of prescription pain medications and can safely control your vehicle with both hands. Call our office and make an appointment for 7-10 days from the date of surgery.     Post Anesthesia Home Care Instructions  Activity: Get plenty of rest for the remainder of the day. A responsible individual must stay with you for 24 hours following the procedure.  For the next 24 hours, DO NOT: -Drive a car -Advertising copywriter -Drink alcoholic beverages -Take any medication unless instructed by your physician -Make any legal decisions or sign important papers.  Meals: Start with liquid foods such as gelatin or soup. Progress to regular foods as tolerated. Avoid greasy, spicy, heavy foods. If nausea and/or vomiting occur, drink only clear liquids until the nausea and/or vomiting subsides. Call your physician if vomiting continues.  Special Instructions/Symptoms: Your throat may feel dry or sore from the anesthesia or the breathing tube placed in your throat during surgery. If this causes discomfort, gargle with warm salt water. The discomfort should disappear within 24 hours.  If you had a scopolamine patch placed behind your ear for the management of post- operative nausea and/or vomiting:  1. The medication in the patch is effective for 72 hours, after which it should be removed.  Wrap  patch in a tissue and discard in the trash. Wash hands thoroughly with soap and water. 2. You may remove the patch earlier than 72 hours if you experience unpleasant side effects which may include dry mouth, dizziness or visual disturbances. 3. Avoid touching the patch. Wash your hands with soap and water after contact with the patch.       Please call [phone] during normal business hours or [phone] after hours for any problems. Including the following:  - excessive redness of the incisions - drainage for more than 4 days - fever of more than 101.5 F  *Please note that pain medications will not be refilled after hours or on weekends.  You may return to work on 07/05/2016 with no driving and no lifting, gripping, and grasping greater than pencil and paper tasks with the right upper extremity.

## 2016-06-29 ENCOUNTER — Encounter (HOSPITAL_BASED_OUTPATIENT_CLINIC_OR_DEPARTMENT_OTHER): Payer: Self-pay | Admitting: Orthopedic Surgery

## 2016-07-01 DIAGNOSIS — S62646D Nondisplaced fracture of proximal phalanx of right little finger, subsequent encounter for fracture with routine healing: Secondary | ICD-10-CM | POA: Diagnosis not present

## 2016-07-05 DIAGNOSIS — M25571 Pain in right ankle and joints of right foot: Secondary | ICD-10-CM | POA: Diagnosis not present

## 2016-07-05 DIAGNOSIS — S62646D Nondisplaced fracture of proximal phalanx of right little finger, subsequent encounter for fracture with routine healing: Secondary | ICD-10-CM | POA: Diagnosis not present

## 2016-07-05 DIAGNOSIS — M79644 Pain in right finger(s): Secondary | ICD-10-CM | POA: Diagnosis not present

## 2016-07-05 DIAGNOSIS — M25649 Stiffness of unspecified hand, not elsewhere classified: Secondary | ICD-10-CM | POA: Diagnosis not present

## 2016-07-05 DIAGNOSIS — M722 Plantar fascial fibromatosis: Secondary | ICD-10-CM | POA: Diagnosis not present

## 2016-07-08 DIAGNOSIS — S62616D Displaced fracture of proximal phalanx of right little finger, subsequent encounter for fracture with routine healing: Secondary | ICD-10-CM | POA: Diagnosis not present

## 2016-07-09 DIAGNOSIS — M25649 Stiffness of unspecified hand, not elsewhere classified: Secondary | ICD-10-CM | POA: Diagnosis not present

## 2016-07-09 DIAGNOSIS — M79644 Pain in right finger(s): Secondary | ICD-10-CM | POA: Diagnosis not present

## 2016-07-09 DIAGNOSIS — S62646D Nondisplaced fracture of proximal phalanx of right little finger, subsequent encounter for fracture with routine healing: Secondary | ICD-10-CM | POA: Diagnosis not present

## 2016-07-14 DIAGNOSIS — S62646D Nondisplaced fracture of proximal phalanx of right little finger, subsequent encounter for fracture with routine healing: Secondary | ICD-10-CM | POA: Diagnosis not present

## 2016-07-14 DIAGNOSIS — M25649 Stiffness of unspecified hand, not elsewhere classified: Secondary | ICD-10-CM | POA: Diagnosis not present

## 2016-07-14 DIAGNOSIS — M79644 Pain in right finger(s): Secondary | ICD-10-CM | POA: Diagnosis not present

## 2016-07-16 DIAGNOSIS — M79644 Pain in right finger(s): Secondary | ICD-10-CM | POA: Diagnosis not present

## 2016-07-16 DIAGNOSIS — M25649 Stiffness of unspecified hand, not elsewhere classified: Secondary | ICD-10-CM | POA: Diagnosis not present

## 2016-07-16 DIAGNOSIS — S62646D Nondisplaced fracture of proximal phalanx of right little finger, subsequent encounter for fracture with routine healing: Secondary | ICD-10-CM | POA: Diagnosis not present

## 2016-07-20 DIAGNOSIS — M25649 Stiffness of unspecified hand, not elsewhere classified: Secondary | ICD-10-CM | POA: Diagnosis not present

## 2016-07-20 DIAGNOSIS — S62646D Nondisplaced fracture of proximal phalanx of right little finger, subsequent encounter for fracture with routine healing: Secondary | ICD-10-CM | POA: Diagnosis not present

## 2016-07-20 DIAGNOSIS — M79644 Pain in right finger(s): Secondary | ICD-10-CM | POA: Diagnosis not present

## 2016-07-23 DIAGNOSIS — M79644 Pain in right finger(s): Secondary | ICD-10-CM | POA: Diagnosis not present

## 2016-07-23 DIAGNOSIS — S62646D Nondisplaced fracture of proximal phalanx of right little finger, subsequent encounter for fracture with routine healing: Secondary | ICD-10-CM | POA: Diagnosis not present

## 2016-07-23 DIAGNOSIS — M25649 Stiffness of unspecified hand, not elsewhere classified: Secondary | ICD-10-CM | POA: Diagnosis not present

## 2016-07-28 DIAGNOSIS — S62646D Nondisplaced fracture of proximal phalanx of right little finger, subsequent encounter for fracture with routine healing: Secondary | ICD-10-CM | POA: Diagnosis not present

## 2016-07-28 DIAGNOSIS — M25649 Stiffness of unspecified hand, not elsewhere classified: Secondary | ICD-10-CM | POA: Diagnosis not present

## 2016-07-28 DIAGNOSIS — M79644 Pain in right finger(s): Secondary | ICD-10-CM | POA: Diagnosis not present

## 2016-07-30 DIAGNOSIS — S62646D Nondisplaced fracture of proximal phalanx of right little finger, subsequent encounter for fracture with routine healing: Secondary | ICD-10-CM | POA: Diagnosis not present

## 2016-07-30 DIAGNOSIS — M79644 Pain in right finger(s): Secondary | ICD-10-CM | POA: Diagnosis not present

## 2016-07-30 DIAGNOSIS — M25649 Stiffness of unspecified hand, not elsewhere classified: Secondary | ICD-10-CM | POA: Diagnosis not present

## 2016-08-09 DIAGNOSIS — S62616D Displaced fracture of proximal phalanx of right little finger, subsequent encounter for fracture with routine healing: Secondary | ICD-10-CM | POA: Diagnosis not present

## 2016-09-06 DIAGNOSIS — S62616D Displaced fracture of proximal phalanx of right little finger, subsequent encounter for fracture with routine healing: Secondary | ICD-10-CM | POA: Diagnosis not present

## 2016-09-13 DIAGNOSIS — M25649 Stiffness of unspecified hand, not elsewhere classified: Secondary | ICD-10-CM | POA: Diagnosis not present

## 2016-09-20 DIAGNOSIS — M25649 Stiffness of unspecified hand, not elsewhere classified: Secondary | ICD-10-CM | POA: Diagnosis not present

## 2016-09-20 DIAGNOSIS — S62646D Nondisplaced fracture of proximal phalanx of right little finger, subsequent encounter for fracture with routine healing: Secondary | ICD-10-CM | POA: Diagnosis not present

## 2016-09-27 DIAGNOSIS — S62646D Nondisplaced fracture of proximal phalanx of right little finger, subsequent encounter for fracture with routine healing: Secondary | ICD-10-CM | POA: Diagnosis not present

## 2016-09-27 DIAGNOSIS — M25649 Stiffness of unspecified hand, not elsewhere classified: Secondary | ICD-10-CM | POA: Diagnosis not present

## 2016-10-04 DIAGNOSIS — M25649 Stiffness of unspecified hand, not elsewhere classified: Secondary | ICD-10-CM | POA: Diagnosis not present

## 2017-01-17 DIAGNOSIS — Z01419 Encounter for gynecological examination (general) (routine) without abnormal findings: Secondary | ICD-10-CM | POA: Diagnosis not present

## 2017-03-11 DIAGNOSIS — Z0000 Encounter for general adult medical examination without abnormal findings: Secondary | ICD-10-CM | POA: Diagnosis not present

## 2017-03-11 DIAGNOSIS — I10 Essential (primary) hypertension: Secondary | ICD-10-CM | POA: Diagnosis not present

## 2017-03-11 DIAGNOSIS — E785 Hyperlipidemia, unspecified: Secondary | ICD-10-CM | POA: Diagnosis not present

## 2017-03-11 DIAGNOSIS — Z1159 Encounter for screening for other viral diseases: Secondary | ICD-10-CM | POA: Diagnosis not present

## 2017-06-20 DIAGNOSIS — R21 Rash and other nonspecific skin eruption: Secondary | ICD-10-CM | POA: Diagnosis not present

## 2017-07-07 DIAGNOSIS — L309 Dermatitis, unspecified: Secondary | ICD-10-CM | POA: Diagnosis not present

## 2017-07-07 DIAGNOSIS — L218 Other seborrheic dermatitis: Secondary | ICD-10-CM | POA: Diagnosis not present

## 2017-09-16 DIAGNOSIS — I10 Essential (primary) hypertension: Secondary | ICD-10-CM | POA: Diagnosis not present

## 2017-09-16 DIAGNOSIS — E785 Hyperlipidemia, unspecified: Secondary | ICD-10-CM | POA: Diagnosis not present

## 2018-01-27 DIAGNOSIS — Z124 Encounter for screening for malignant neoplasm of cervix: Secondary | ICD-10-CM | POA: Diagnosis not present

## 2018-01-27 DIAGNOSIS — Z01419 Encounter for gynecological examination (general) (routine) without abnormal findings: Secondary | ICD-10-CM | POA: Diagnosis not present

## 2018-01-27 DIAGNOSIS — Z1231 Encounter for screening mammogram for malignant neoplasm of breast: Secondary | ICD-10-CM | POA: Diagnosis not present

## 2018-04-21 DIAGNOSIS — E6609 Other obesity due to excess calories: Secondary | ICD-10-CM | POA: Diagnosis not present

## 2018-04-21 DIAGNOSIS — Z0000 Encounter for general adult medical examination without abnormal findings: Secondary | ICD-10-CM | POA: Diagnosis not present

## 2018-04-21 DIAGNOSIS — I10 Essential (primary) hypertension: Secondary | ICD-10-CM | POA: Diagnosis not present

## 2018-04-21 DIAGNOSIS — E785 Hyperlipidemia, unspecified: Secondary | ICD-10-CM | POA: Diagnosis not present

## 2018-11-30 ENCOUNTER — Other Ambulatory Visit: Payer: Self-pay

## 2018-11-30 ENCOUNTER — Encounter (HOSPITAL_COMMUNITY): Payer: Self-pay

## 2018-11-30 ENCOUNTER — Ambulatory Visit (INDEPENDENT_AMBULATORY_CARE_PROVIDER_SITE_OTHER): Payer: 59

## 2018-11-30 ENCOUNTER — Ambulatory Visit (HOSPITAL_COMMUNITY)
Admission: EM | Admit: 2018-11-30 | Discharge: 2018-11-30 | Disposition: A | Discharge status: Home / Self Care | Payer: 59 | Attending: Emergency Medicine | Admitting: Emergency Medicine

## 2018-11-30 DIAGNOSIS — S20212A Contusion of left front wall of thorax, initial encounter: Secondary | ICD-10-CM

## 2018-11-30 DIAGNOSIS — W108XXA Fall (on) (from) other stairs and steps, initial encounter: Secondary | ICD-10-CM

## 2018-11-30 DIAGNOSIS — M546 Pain in thoracic spine: Secondary | ICD-10-CM

## 2018-11-30 DIAGNOSIS — R0781 Pleurodynia: Secondary | ICD-10-CM

## 2018-11-30 MED ORDER — KETOROLAC TROMETHAMINE 30 MG/ML IJ SOLN
30.0000 mg | Freq: Once | INTRAMUSCULAR | Status: AC
  Administered 2018-11-30: 30 mg via INTRAMUSCULAR

## 2018-11-30 MED ORDER — CYCLOBENZAPRINE HCL 5 MG PO TABS: 5.0000 mg | ORAL_TABLET | Freq: Two times a day (BID) | ORAL | 0 refills | Status: DC | PRN

## 2018-11-30 MED ORDER — NAPROXEN 500 MG PO TABS: 500.0000 mg | ORAL_TABLET | Freq: Two times a day (BID) | ORAL | 0 refills | Status: DC

## 2018-11-30 MED ORDER — KETOROLAC TROMETHAMINE 30 MG/ML IJ SOLN
INTRAMUSCULAR | Status: AC
  Filled 2018-11-30: qty 1

## 2018-11-30 NOTE — ED Provider Notes (Signed)
[facility]    CSN: [id number] Arrival date & time: 11/30/18  1718      History   Chief Complaint Chief Complaint  Patient presents with   Fall    HPI Traci Baker is a 58 y.o. female history of hypertension, hyperlipidemia, arthritis presenting today for evaluation of left side and back pain.  Patient states that 2 days ago as she was going up some stairs, she tripped and fell into a door frame and landed on her left side.  Since she has had pain to her left rib cage.  Has been using Advil 400 mg and icy hot without relief.  She has slight discomfort with taking a deep breath, but denies shortness of breath.  Denies chest pain.  Denies abdominal pain nausea or vomiting.  Denies hitting head or loss of consciousness.  HPI  Past Medical History:  Diagnosis Date   Arthritis    Finger fracture, right    right small finger   Hyperlipidemia    Hypertension     There are no active problems to display for this patient.   Past Surgical History:  Procedure Laterality Date   COLONOSCOPY     MINOR OPEN REDUCTION INTERNAL FIXATION (ORIF) FINGER Right 06/28/2016   Procedure: OPEN TREATMENT OF RIGHT SMALL FINGER FRACTURE;  Surgeon: Mack Hookavid Thompson, MD;  Location: [facility];  Service: Orthopedics;  Laterality: Right;    OB History   No obstetric history on file.      Home Medications    Prior to Admission medications   Medication Sig Start Date End Date Taking? Authorizing Provider  acetaminophen (TYLENOL) 325 MG tablet Take 2 tablets (650 mg total) by mouth every 6 (six) hours as needed for mild pain or moderate pain. 06/28/16   Mack Hookhompson, David, MD  amLODipine (NORVASC) 10 MG tablet Take 10 mg by mouth daily.    [provider]  cyclobenzaprine (FLEXERIL) 5 MG tablet Take 1-2 tablets (5-10 mg total) by mouth 2 (two) times daily as needed for muscle spasms. 11/30/18   Samanda Buske C, PA-C  naproxen (NAPROSYN) 500 MG tablet Take 1 tablet  (500 mg total) by mouth 2 (two) times daily. 11/30/18   Jazsmin Couse C, PA-C  simvastatin (ZOCOR) 20 MG tablet Take 20 mg by mouth daily.    [provider]    Family History Family History  Problem Relation Age of Onset   Hypertension Mother    Cancer Mother    Hypertension Father    Cancer Father     Social History Social History   Tobacco Use   Smoking status: Never Smoker   Smokeless tobacco: Never Used  Substance Use Topics   Alcohol use: No   Drug use: No     Allergies   Lisinopril   Review of Systems Review of Systems  Constitutional: Negative for fatigue and fever.  HENT: Negative for congestion, sinus pressure and sore throat.   Eyes: Negative for photophobia, pain and visual disturbance.  Respiratory: Negative for cough and shortness of breath.   Cardiovascular: Negative for chest pain.  Gastrointestinal: Negative for abdominal pain, nausea and vomiting.  Genitourinary: Positive for flank pain.  Musculoskeletal: Positive for back pain and myalgias. Negative for neck pain and neck stiffness.  Neurological: Negative for dizziness, syncope, facial asymmetry, speech difficulty, weakness, light-headedness, numbness and headaches.     Physical Exam Triage Vital Signs ED Triage Vitals  Enc Vitals Group     BP 11/30/18 1738 Marland Kitchen(!)  146/92     Pulse Rate 11/30/18 1738 70     Resp 11/30/18 1738 16     Temp 11/30/18 1738 98.1 F (36.7 C)     Temp Source 11/30/18 1738 Temporal     SpO2 11/30/18 1738 98 %     Weight --      Height --      Head Circumference --      Peak Flow --      Pain Score 11/30/18 1751 8     Pain Loc --      Pain Edu? --      Excl. in GC? --    No data found.  Updated Vital Signs BP (!) 146/92 (BP Location: Right Arm)    Pulse 70    Temp 98.1 F (36.7 C) (Temporal)    Resp 16    SpO2 98%   Visual Acuity Right Eye Distance:   Left Eye Distance:   Bilateral Distance:    Right Eye Near:   Left Eye Near:      Bilateral Near:     Physical Exam Vitals signs and nursing note reviewed.  Constitutional:      Appearance: She is well-developed.     Comments: No acute distress  HENT:     Head: Normocephalic and atraumatic.     Nose: Nose normal.  Eyes:     Conjunctiva/sclera: Conjunctivae normal.  Neck:     Musculoskeletal: Neck supple.  Cardiovascular:     Rate and Rhythm: Normal rate.  Pulmonary:     Effort: Pulmonary effort is normal. No respiratory distress.     Comments: Breathing comfortably at rest, CTABL, no wheezing, rales or other adventitious sounds auscultated Abdominal:     General: There is no distension.     Comments: Nondistended, nontender to light and deep palpation throughout abdomen  Musculoskeletal: Normal range of motion.     Comments: Nontender to palpation throughout anterior chest, tenderness to palpation to mid thoracic region near flank/mid axillary line   Nontender to palpation of spine midline  Full active range of motion of upper extremities, gait without abnormality  Skin:    General: Skin is warm and dry.  Neurological:     Mental Status: She is alert and oriented to person, place, and time.      UC Treatments / Results  Labs (all labs ordered are listed, but only abnormal results are displayed) Labs Reviewed - No data to display  EKG   Radiology Dg Ribs Unilateral W/chest Left  Result Date: 11/30/2018 CLINICAL DATA:  Patient status post fall.  Left rib pain. EXAM: LEFT RIBS AND CHEST - 3+ VIEW COMPARISON:  None. FINDINGS: Normal cardiac and mediastinal contours. Low lung volumes. Bibasilar heterogeneous opacities. No pleural effusion or pneumothorax. Limited visualization of the left lower posterior ribs. No definite displaced rib fracture. IMPRESSION: Low lung volumes with basilar opacities favored represent atelectasis. Limited visualization of the posterior lower left ribs. No definite displaced rib fracture. Electronically Signed   By: Annia Belt M.D.   On: 11/30/2018 18:39    Procedures Procedures (including critical care time)  Medications Ordered in UC Medications  ketorolac (TORADOL) 30 MG/ML injection 30 mg (30 mg Intramuscular Given 11/30/18 1847)  ketorolac (TORADOL) 30 MG/ML injection (has no administration in time range)    Initial Impression / Assessment and Plan / UC Course  I have reviewed the triage vital signs and the nursing notes.  Pertinent labs & imaging results that  were available during my care of the patient were reviewed by me and considered in my medical decision making (see chart for details).     X-ray negative for any fractures.  Has bilateral atelectasis, feel this is most likely over opacities.  We will continue to monitor symptoms.  Likely more contusion/strain from fall.  Will treat with anti-inflammatories and muscle relaxers, ice and heat.Discussed strict return precautions. Patient verbalized understanding and is agreeable with plan.  Final Clinical Impressions(s) / UC Diagnoses   Final diagnoses:  Rib contusion, left, initial encounter  Acute left-sided thoracic back pain     Discharge Instructions     Naprosyn twice daily with food You may use flexeril as needed to help with pain. This is a muscle relaxer and causes sedation- please use only at bedtime or when you will be home and not have to drive/work Alternate ice and heat to area Gentle stretching  Follow up if not improving    ED Prescriptions    Medication Sig Dispense Auth. Provider   naproxen (NAPROSYN) 500 MG tablet Take 1 tablet (500 mg total) by mouth 2 (two) times daily. 30 tablet Sanjeev Main C, PA-C   cyclobenzaprine (FLEXERIL) 5 MG tablet Take 1-2 tablets (5-10 mg total) by mouth 2 (two) times daily as needed for muscle spasms. 24 tablet Dameian Crisman, [place] C, PA-C     Controlled Substance Prescriptions [place] Controlled Substance Registry consulted? Not Applicable   Janith Lima, [place] 11/30/18 1930

## 2018-11-30 NOTE — ED Triage Notes (Signed)
Patient presents to Urgent Care with complaints of tripping over the door frame and falling on her left rib cage since 2 days ago. Patient reports she has been using icy hot and taking advil with minimal relief.

## 2018-11-30 NOTE — Discharge Instructions (Signed)
Naprosyn twice daily with food You may use flexeril as needed to help with pain. This is a muscle relaxer and causes sedation- please use only at bedtime or when you will be home and not have to drive/work Alternate ice and heat to area Gentle stretching  Follow up if not improving

## 2018-12-19 ENCOUNTER — Ambulatory Visit (HOSPITAL_COMMUNITY)
Admission: EM | Admit: 2018-12-19 | Discharge: 2018-12-19 | Disposition: A | Discharge status: Home / Self Care | Payer: 59 | Attending: Family Medicine | Admitting: Family Medicine

## 2018-12-19 ENCOUNTER — Encounter (HOSPITAL_COMMUNITY): Payer: Self-pay

## 2018-12-19 ENCOUNTER — Ambulatory Visit (INDEPENDENT_AMBULATORY_CARE_PROVIDER_SITE_OTHER): Payer: 59

## 2018-12-19 ENCOUNTER — Other Ambulatory Visit: Payer: Self-pay

## 2018-12-19 DIAGNOSIS — S63502A Unspecified sprain of left wrist, initial encounter: Secondary | ICD-10-CM

## 2018-12-19 MED ORDER — IBUPROFEN 800 MG PO TABS: 800.0000 mg | ORAL_TABLET | Freq: Three times a day (TID) | ORAL | 0 refills | Status: AC

## 2018-12-19 NOTE — ED Triage Notes (Signed)
Pt states she sprain her left wrist while cleaning out her tub yesterday.

## 2018-12-19 NOTE — ED Provider Notes (Signed)
[facility]    CSN: [id number] Arrival date & time: 12/19/18  1655      History   Chief Complaint Chief Complaint  Patient presents with  . Wrist Pain    HPI Traci Baker is a 58 y.o. female.   HPI  Was cleaning out her shower and tub yesterday.  Was up on a stepladder pain reaching overhead.  She states that the ladder slipped and she wrenched her wrist, hit her head.  She is uncertain how she landed.  No loss of consciousness.  She still has wrist pain today.  When seated she cannot put her hands beside her and push herself up without pain in her wrist.  It is swollen.  Past Medical History:  Diagnosis Date  . Arthritis   . Finger fracture, right    right small finger  . Hyperlipidemia   . Hypertension     There are no active problems to display for this patient.   Past Surgical History:  Procedure Laterality Date  . COLONOSCOPY    . MINOR OPEN REDUCTION INTERNAL FIXATION (ORIF) FINGER Right 06/28/2016   Procedure: OPEN TREATMENT OF RIGHT SMALL FINGER FRACTURE;  Surgeon: Mack Hookavid Thompson, MD;  [facility]  [facility];  Service: Orthopedics;  Laterality: Right;    OB History   No obstetric history on file.      Home Medications    Prior to Admission medications   Medication Sig Start Date End Date Taking? Authorizing Provider  acetaminophen (TYLENOL) 325 MG tablet Take 2 tablets (650 mg total) by mouth every 6 (six) hours as needed for mild pain or moderate pain. 06/28/16   Mack Hookhompson, David, MD  amLODipine (NORVASC) 10 MG tablet Take 10 mg by mouth daily.    [provider]  ibuprofen (ADVIL) 800 MG tablet Take 1 tablet (800 mg total) by mouth 3 (three) times daily. 12/19/18   Eustace MooreNelson, Daralyn Bert Sue, MD  simvastatin (ZOCOR) 20 MG tablet Take 20 mg by mouth daily.    [provider]    Family History Family History  Problem Relation Age of Onset  . Hypertension Mother   . Cancer Mother   . Hypertension Father   . Cancer  Father     Social History Social History   Tobacco Use  . Smoking status: Never Smoker  . Smokeless tobacco: Never Used  Substance Use Topics  . Alcohol use: No  . Drug use: No     Allergies   Lisinopril   Review of Systems Review of Systems  Constitutional: Negative for chills and fever.  HENT: Negative for ear pain and sore throat.   Eyes: Negative for pain and visual disturbance.  Respiratory: Negative for cough and shortness of breath.   Cardiovascular: Negative for chest pain and palpitations.  Gastrointestinal: Negative for abdominal pain and vomiting.  Genitourinary: Negative for dysuria and hematuria.  Musculoskeletal: Positive for arthralgias. Negative for back pain.  Skin: Negative for color change and rash.  Neurological: Negative for seizures and syncope.  All other systems reviewed and are negative.    Physical Exam Triage Vital Signs ED Triage Vitals  Enc Vitals Group     BP 12/19/18 1710 (!) 145/96     Pulse Rate 12/19/18 1710 97     Resp 12/19/18 1710 15     Temp 12/19/18 1710 98.1 F (36.7 C)     Temp Source 12/19/18 1710 Oral     SpO2 12/19/18 1710 100 %  Weight 12/19/18 1711 198 lb (89.8 kg)     Height --      Head Circumference --      Peak Flow --      Pain Score 12/19/18 1711 4     Pain Loc --      Pain Edu? --      Excl. in [place]? --    No data found.  Updated Vital Signs BP (!) 145/96 (BP Location: Right Arm)   Pulse 97   Temp 98.1 F (36.7 C) (Oral)   Resp 15   Wt 89.8 kg   SpO2 100%   BMI 33.99 kg/m      Physical Exam Constitutional:      General: She is not in acute distress.    Appearance: She is well-developed.  HENT:     Head: Normocephalic and atraumatic.  Eyes:     Conjunctiva/sclera: Conjunctivae normal.     Pupils: Pupils are equal, round, and reactive to light.  Neck:     Musculoskeletal: Normal range of motion.  Cardiovascular:     Rate and Rhythm: Normal rate.  Pulmonary:     Effort: Pulmonary  effort is normal. No respiratory distress.  Abdominal:     General: There is no distension.     Palpations: Abdomen is soft.  Musculoskeletal: Normal range of motion.     Comments: The left wrist is examined.  There is mild soft tissue swelling.  There is tenderness over the scaphoid.  No tenderness over the distal radius or ulna.  No tenderness over the metacarpals or hand joints.  Patient can flex the wrist about 50% of normal but can extend it only about 10 degrees.  Skin:    General: Skin is warm and dry.  Neurological:     Mental Status: She is alert.      UC Treatments / Results  Labs (all labs ordered are listed, but only abnormal results are displayed) Labs Reviewed - No data to display  EKG   Radiology Dg Wrist Complete Left  Result Date: 12/19/2018 CLINICAL DATA:  58 year old who fell from a step ladder while cleaning her shower, injuring the LEFT wrist. Initial encounter. EXAM: LEFT WRIST - COMPLETE 3+ VIEW COMPARISON:  None. FINDINGS: Diffuse soft tissue swelling. No evidence of acute, subacute or healed fractures. Well corticated ossific density adjacent to the trapezium at the first metacarpal joint is likely either an accessory ossicle or the sequelae of remote trauma. Joint spaces well-preserved. Bone mineral density well-preserved. IMPRESSION: No acute osseous abnormality. Electronically Signed   By: [name] M.D.   On: 12/19/2018 17:50    Procedures Procedures (including critical care time)  Medications Ordered in UC Medications - No data to display  Initial Impression / Assessment and Plan / UC Course  I have reviewed the triage vital signs and the nursing notes.  Pertinent labs & imaging results that were available during my care of the patient were reviewed by me and considered in my medical decision making (see chart for details).    No fracture identified.  Showed patient her films.  Discussed wrist sprain and expected recovery Final Clinical  Impressions(s) / UC Diagnoses   Final diagnoses:  Sprain of left wrist, initial encounter     Discharge Instructions     Use ice for 20 minutes every couple hours to reduce the pain and swelling Take ibuprofen 3 times a day with food for pain and inflammation Wear brace as long as necessary for  pain control.  Likely will be needed for 2 to 3 weeks Follow-up with your doctor, or see an orthopedist if you fail to improve in a couple weeks Call for problems    ED Prescriptions    Medication Sig Dispense Auth. Provider   ibuprofen (ADVIL) 800 MG tablet Take 1 tablet (800 mg total) by mouth 3 (three) times daily. 21 tablet Eustace Moore, MD     PDMP not reviewed this encounter.   Eustace Moore, MD 12/19/18 Flossie Buffy

## 2018-12-19 NOTE — Discharge Instructions (Signed)
Use ice for 20 minutes every couple hours to reduce the pain and swelling Take ibuprofen 3 times a day with food for pain and inflammation Wear brace as long as necessary for pain control.  Likely will be needed for 2 to 3 weeks Follow-up with your doctor, or see an orthopedist if you fail to improve in a couple weeks Call for problems

## 2019-02-16 IMAGING — RF DG FINGER LITTLE 2+V*R*
1 series · 3 of 3 positions shown · non-contrast
Comparison: None

CLINICAL DATA: ORIF RIGHT little finger

EXAM:
DG C-ARM 61-120 MIN; RIGHT LITTLE FINGER 2+V

[Series 1: run · 3 of 3 slices shown]
[im 1/3]
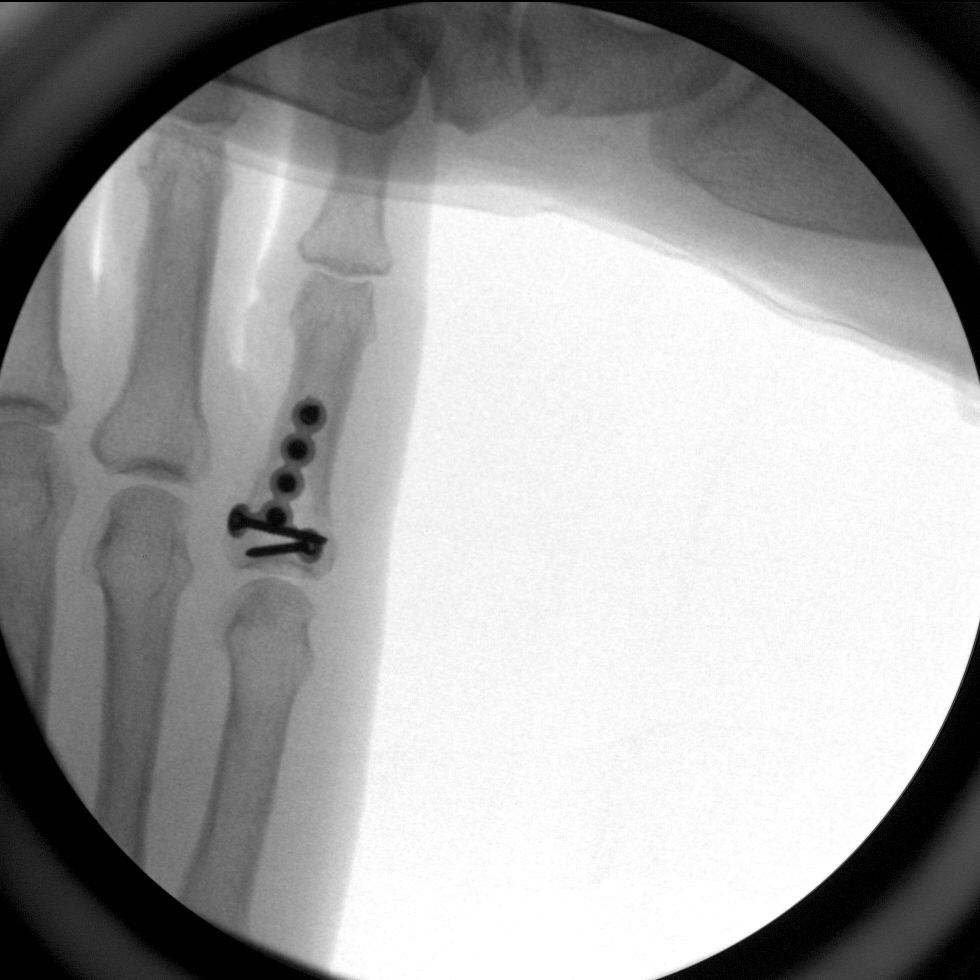
[im 2/3]
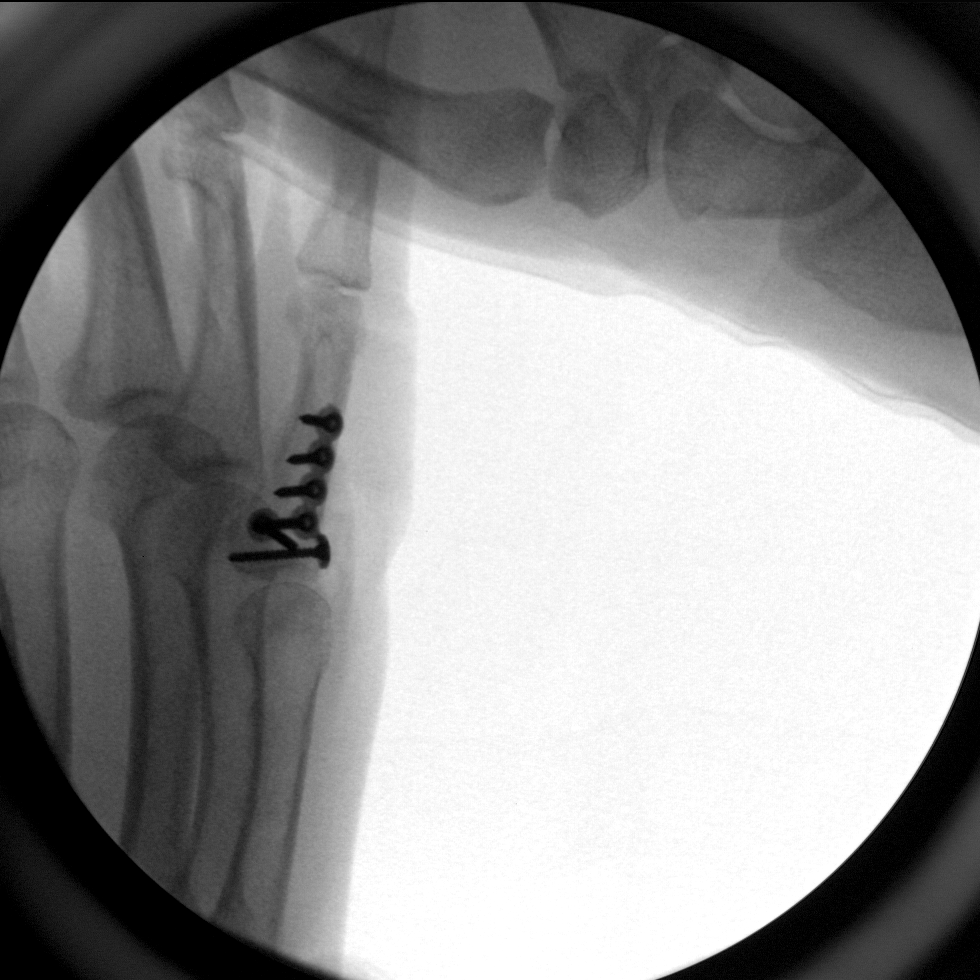
[im 3/3]
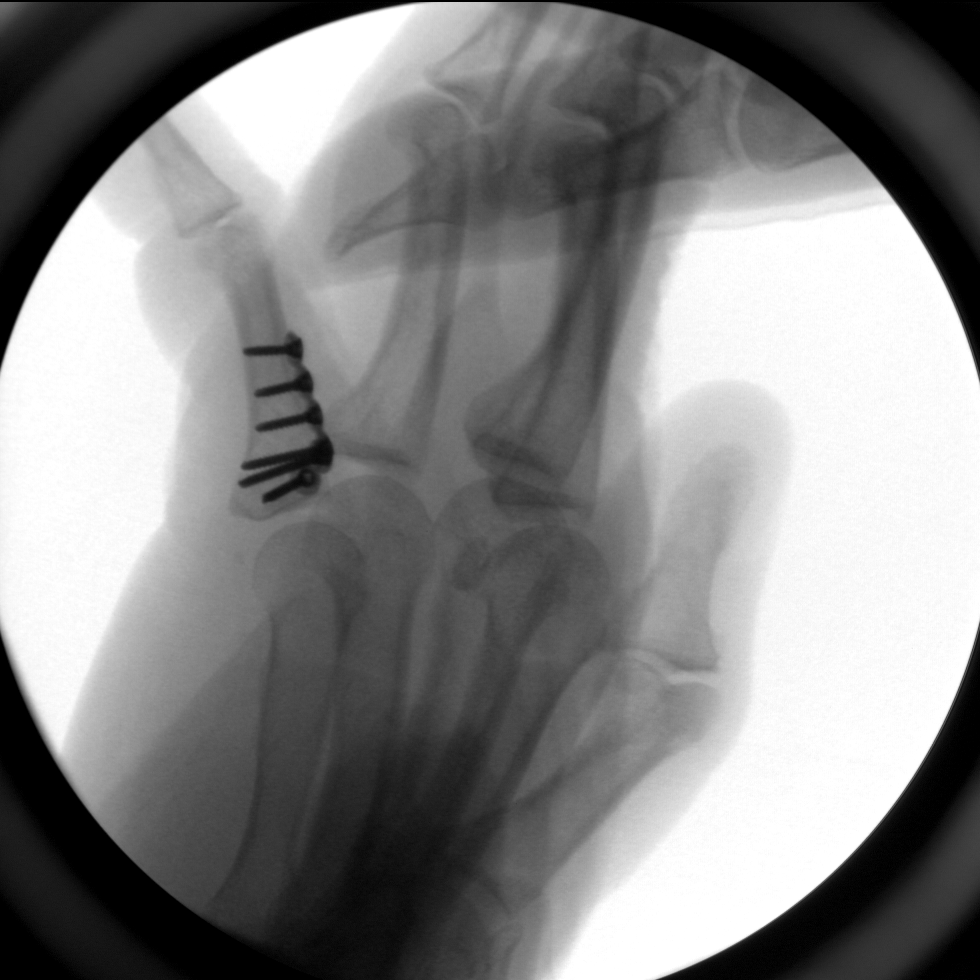

[3 of 3 positions shown; findings below may reference images not displayed]

FINDINGS: Images demonstrate placement of a plate and multiple screws at the
base of the proximal phalanx of the RIGHT little finger.

No dislocation or definite fracture identified.
IMPRESSION: Post ORIF of the base of the proximal phalanx of the RIGHT little
finger.

## 2019-05-23 ENCOUNTER — Other Ambulatory Visit: Payer: Self-pay

## 2019-05-23 ENCOUNTER — Encounter: Payer: Self-pay | Admitting: Orthopaedic Surgery

## 2019-05-23 ENCOUNTER — Ambulatory Visit: Payer: Self-pay

## 2019-05-23 ENCOUNTER — Ambulatory Visit: Payer: 59 | Admitting: Orthopaedic Surgery

## 2019-05-23 VITALS — BP 140/90 | HR 76 | Ht 63.0 in | Wt 194.0 lb

## 2019-05-23 DIAGNOSIS — M25532 Pain in left wrist: Secondary | ICD-10-CM | POA: Diagnosis not present

## 2019-05-23 MED ORDER — DICLOFENAC SODIUM 1 % EX GEL: 2.0000 g | Freq: Four times a day (QID) | CUTANEOUS | 1 refills | Status: AC

## 2019-05-23 MED ORDER — DICLOFENAC SODIUM 1 % EX GEL: 4.0000 g | Freq: Four times a day (QID) | CUTANEOUS | Status: AC

## 2019-05-23 NOTE — Progress Notes (Signed)
Office Visit Note   Patient: Traci Baker           Date of Birth: 04-16-1960           MRN: [id number] Visit Date: 05/23/2019              Requested by: No referring provider defined for this encounter. PCP: Deatra Broyles, MD   Assessment & Plan: Visit Diagnoses:  1. Pain in left wrist     Plan: Voltaren gel prescribed she can apply 3 times a day to her wrist.  Wrist when applied she can use intermittently.  We will recheck her in 4 to 5 weeks if she is having persistent symptoms we will consider diagnostic MRI imaging of the wrist.  Follow-Up Instructions: No follow-ups on file.   Orders:  Orders Placed This Encounter  Procedures  . XR Wrist Complete Left   No orders of the defined types were placed in this encounter.     Procedures: No procedures performed   Clinical Data: No additional findings.   Subjective: Chief Complaint  Patient presents with  . 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  She also works part-time for [employer].  She had a fall in September 2020 x-rays of her wrist were negative for acute fracture.  She is gradually had some increased pain at the end of the day aching in her wrist some pain and difficulty in the morning.  She is used some meloxicam without relief.  Patient is right-hand dominant no problems of the right hand or wrist.  Her discomfort is at the radiocarpal joint across the volar and dorsum.  She does not note any increased pain with any particular motion besides using her wrist a lot.  When she works for [employer] she states it really does not bother her.  Review of Systems previous right small finger proximal phalanx fracture treated by Dr.Thompson.  Positive for hypertension otherwise negative pertains HPI negative rheumatologic conditions.  Negative for neck pain.   Objective: Vital Signs: BP 140/90   Pulse 76   Ht 5\' 3"  (1.6 m)   Wt 194 lb  (88 kg)   BMI 34.37 kg/m   Physical Exam Constitutional:      Appearance: She is well-developed.  HENT:     Head: Normocephalic.     Right Ear: External ear normal.     Left Ear: External ear normal.  Eyes:     Pupils: Pupils are equal, round, and reactive to light.  Neck:     Thyroid: No thyromegaly.     Trachea: No tracheal deviation.  Cardiovascular:     Rate and Rhythm: Normal rate.  Pulmonary:     Effort: Pulmonary effort is normal.  Abdominal:     Palpations: Abdomen is soft.  Skin:    General: Skin is warm and dry.  Neurological:     Mental Status: She is alert and oriented to person, place, and time.  Psychiatric:        Behavior: Behavior normal.     Ortho Exam patient has full symmetrical wrist flexion extension with minimal discomfort.  Some tenderness across the radial scaphoid joint and ulnocarpal joint.  Full supination pronation without pain.  Minimal discomfort with radial and ulnar deviation not well localized.  Station the hand is intact thenar hyperthenar is normal carpal tunnel exam is negative normal flexion extension function.  Negative cervical  compression negative Spurling no brachial plexus tenderness full rotation and tilting cervical spine right and left.  Specialty Comments:  No specialty comments available.  Imaging: No results found.   PMFS History: There are no problems to display for this patient.  Past Medical History:  Diagnosis Date  . Arthritis   . Finger fracture, right    right small finger  . Hyperlipidemia   . Hypertension     Family History  Problem Relation Age of Onset  . Hypertension Mother   . Cancer Mother   . Hypertension Father   . Cancer Father     Past Surgical History:  Procedure Laterality Date  . COLONOSCOPY    . MINOR OPEN REDUCTION INTERNAL FIXATION (ORIF) FINGER Right 06/28/2016   Procedure: OPEN TREATMENT OF RIGHT SMALL FINGER FRACTURE;  Surgeon: Milly Jakob, MD;  Location: [facility];  Service: Orthopedics;  Laterality: Right;   Social History   Occupational History  . Not on file  Tobacco Use  . Smoking status: Never Smoker  . Smokeless tobacco: Never Used  Substance and Sexual Activity  . Alcohol use: No  . Drug use: No  . Sexual activity: Not on file

## 2019-06-05 ENCOUNTER — Telehealth: Payer: Self-pay | Admitting: Radiology

## 2019-06-20 ENCOUNTER — Other Ambulatory Visit: Payer: Self-pay

## 2019-06-20 ENCOUNTER — Encounter: Payer: Self-pay | Admitting: Orthopaedic Surgery

## 2019-06-20 ENCOUNTER — Ambulatory Visit: Payer: 59 | Admitting: Orthopaedic Surgery

## 2019-06-20 DIAGNOSIS — M25832 Other specified joint disorders, left wrist: Secondary | ICD-10-CM | POA: Diagnosis not present

## 2019-06-20 NOTE — Progress Notes (Signed)
Office Visit Note   Patient: Traci Baker           Date of Birth: 25-Feb-1961           MRN: [id number] Visit Date: 06/20/2019              Requested by: Donald Prose, MD [address],  [place] [address] PCP: Donald Prose, MD   Assessment & Plan: Visit Diagnoses:  1. Ulnar impaction syndrome, left     Plan: Due to persistent symptoms we will obtain MRI scan of her left wrist.  She has failed splinting conservative treatment with anti-inflammatories topical gel, activity modification as best she can.  She has used the wrist repetitively on her job.  Also follow-up after MRI scan.  Follow-Up Instructions: No follow-ups on file.   Orders:  No orders of the defined types were placed in this encounter.  No orders of the defined types were placed in this encounter.     Procedures: No procedures performed   Clinical Data: No additional findings.   Subjective: Chief Complaint  Patient presents with  . 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  She has had increased pain in her left nondominant wrist with aching.  She is used a splint taking lots of can with persistent discomfort.  She has ulnar positive variant and impaction noted on plain radiographs with cystic changes in the lunate.  She thinks she might of gotten slightly better with Voltaren gel plus the wrist splint.  Review of Systems past history of proximal phalanx fracture small finger treated by Dr. Grandville Silos.  Positive for hypertension otherwise negative is obtains HPI.   Objective: Vital Signs: BP 135/87   Pulse 80   Ht 5\' 3"  (1.6 m)   Wt 175 lb (79.4 kg)   BMI 31.00 kg/m   Physical Exam Constitutional:      Appearance: She is well-developed.  HENT:     Head: Normocephalic.     Right Ear: External ear normal.     Left Ear: External ear normal.  Eyes:     Pupils:  Pupils are equal, round, and reactive to light.  Neck:     Thyroid: No thyromegaly.     Trachea: No tracheal deviation.  Cardiovascular:     Rate and Rhythm: Normal rate.  Pulmonary:     Effort: Pulmonary effort is normal.  Abdominal:     Palpations: Abdomen is soft.  Skin:    General: Skin is warm and dry.  Neurological:     Mental Status: She is alert and oriented to person, place, and time.  Psychiatric:        Behavior: Behavior normal.     Ortho Exam patient has discomfort with radial ulnar deviation.  No definite wrist effusion.  Some tenderness at the base of the thumb negative grind test.  First dorsal compartment Finkelstein test negative.  No triggering of the thumb.  No tenderness over the flexor tendons thenar muscles normal negative carpal tunnel findings.  Good cervical range of motion no brachial plexus tenderness. Specialty Comments:  No specialty comments available.  Imaging: No results found.   PMFS History: Patient Active Problem List   Diagnosis Date Noted  . Ulnar impaction syndrome, left 06/20/2019  . Pain in left wrist 05/23/2019   Past Medical History:  Diagnosis Date  . Arthritis   .  Finger fracture, right    right small finger  . Hyperlipidemia   . Hypertension     Family History  Problem Relation Age of Onset  . Hypertension Mother   . Cancer Mother   . Hypertension Father   . Cancer Father     Past Surgical History:  Procedure Laterality Date  . COLONOSCOPY    . MINOR OPEN REDUCTION INTERNAL FIXATION (ORIF) FINGER Right 06/28/2016   Procedure: OPEN TREATMENT OF RIGHT SMALL FINGER FRACTURE;  Surgeon: Mack Hook, MD;  Location: [facility];  Service: Orthopedics;  Laterality: Right;   Social History   Occupational History  . Not on file  Tobacco Use  . Smoking status: Never Smoker  . Smokeless tobacco: Never Used  Substance and Sexual Activity  . Alcohol use: No  . Drug use: No  . Sexual activity: Not on file
